# Patient Record
Sex: Male | Born: 1985
Health system: Southern US, Community
[De-identification: ages and names within clinical notes are randomized; demographics above are authoritative.]

## PROBLEM LIST (undated history)

## (undated) DIAGNOSIS — B20 Human immunodeficiency virus [HIV] disease: Secondary | ICD-10-CM

## (undated) DIAGNOSIS — Z21 Asymptomatic human immunodeficiency virus [HIV] infection status: Secondary | ICD-10-CM

## (undated) DIAGNOSIS — J189 Pneumonia, unspecified organism: Secondary | ICD-10-CM

## (undated) DIAGNOSIS — I10 Essential (primary) hypertension: Secondary | ICD-10-CM

## (undated) DIAGNOSIS — E119 Type 2 diabetes mellitus without complications: Secondary | ICD-10-CM

## (undated) DIAGNOSIS — A539 Syphilis, unspecified: Secondary | ICD-10-CM

---

## 2007-02-09 ENCOUNTER — Emergency Department (HOSPITAL_COMMUNITY): Admission: EM | Admit: 2007-02-09 | Discharge: 2007-02-09 | Payer: Self-pay | Admitting: Emergency Medicine

## 2008-08-23 ENCOUNTER — Emergency Department (HOSPITAL_COMMUNITY): Admission: EM | Admit: 2008-08-23 | Discharge: 2008-08-23 | Payer: Self-pay | Admitting: Emergency Medicine

## 2008-09-14 ENCOUNTER — Emergency Department (HOSPITAL_COMMUNITY): Admission: EM | Admit: 2008-09-14 | Discharge: 2008-09-14 | Payer: Self-pay | Admitting: Family Medicine

## 2008-09-17 ENCOUNTER — Emergency Department (HOSPITAL_COMMUNITY): Admission: EM | Admit: 2008-09-17 | Discharge: 2008-09-17 | Payer: Self-pay | Admitting: Emergency Medicine

## 2009-02-06 ENCOUNTER — Emergency Department (HOSPITAL_COMMUNITY): Admission: EM | Admit: 2009-02-06 | Discharge: 2009-02-06 | Payer: Self-pay | Admitting: Emergency Medicine

## 2009-10-17 ENCOUNTER — Emergency Department (HOSPITAL_COMMUNITY): Admission: EM | Admit: 2009-10-17 | Discharge: 2009-10-17 | Payer: Self-pay | Admitting: Family Medicine

## 2011-02-22 ENCOUNTER — Emergency Department (HOSPITAL_BASED_OUTPATIENT_CLINIC_OR_DEPARTMENT_OTHER)
Admission: EM | Admit: 2011-02-22 | Discharge: 2011-02-22 | Disposition: A | Discharge status: Home / Self Care | Payer: PRIVATE HEALTH INSURANCE | Attending: Orthopaedic Surgery | Admitting: Orthopaedic Surgery

## 2011-02-22 DIAGNOSIS — I10 Essential (primary) hypertension: Secondary | ICD-10-CM | POA: Insufficient documentation

## 2011-02-22 DIAGNOSIS — J329 Chronic sinusitis, unspecified: Secondary | ICD-10-CM | POA: Insufficient documentation

## 2011-02-22 DIAGNOSIS — J3489 Other specified disorders of nose and nasal sinuses: Secondary | ICD-10-CM | POA: Insufficient documentation

## 2011-09-16 LAB — POCT INFECTIOUS MONO SCREEN: Mono Screen: NEGATIVE

## 2011-12-26 ENCOUNTER — Encounter (HOSPITAL_BASED_OUTPATIENT_CLINIC_OR_DEPARTMENT_OTHER): Payer: Self-pay | Admitting: *Deleted

## 2011-12-26 ENCOUNTER — Emergency Department (HOSPITAL_BASED_OUTPATIENT_CLINIC_OR_DEPARTMENT_OTHER)
Admission: EM | Admit: 2011-12-26 | Discharge: 2011-12-26 | Disposition: A | Discharge status: Home / Self Care | Payer: PRIVATE HEALTH INSURANCE | Attending: Emergency Medicine | Admitting: Emergency Medicine

## 2011-12-26 DIAGNOSIS — R197 Diarrhea, unspecified: Secondary | ICD-10-CM | POA: Insufficient documentation

## 2011-12-26 DIAGNOSIS — J45909 Unspecified asthma, uncomplicated: Secondary | ICD-10-CM | POA: Insufficient documentation

## 2011-12-26 DIAGNOSIS — J029 Acute pharyngitis, unspecified: Secondary | ICD-10-CM | POA: Insufficient documentation

## 2011-12-26 LAB — RAPID STREP SCREEN (MED CTR MEBANE ONLY): Streptococcus, Group A Screen (Direct): NEGATIVE

## 2011-12-26 NOTE — ED Provider Notes (Signed)
History     CSN: [id number]  Arrival date & time 12/26/11  1819   First MD Initiated Contact with Patient 12/26/11 1823      Chief Complaint  Patient presents with  . Sore Throat  . Diarrhea    (Consider location/radiation/quality/duration/timing/severity/associated sxs/prior treatment) Patient is a 26 y.o. male presenting with pharyngitis and diarrhea. The history is provided by the patient. No language interpreter was used.  Sore Throat This is a new problem. The current episode started today. The problem occurs constantly. The problem has been unchanged. Pertinent negatives include no abdominal pain, fever, rash or vomiting. The symptoms are aggravated by swallowing. He has tried nothing for the symptoms.  Diarrhea The primary symptoms include diarrhea. Primary symptoms do not include fever, abdominal pain, vomiting or rash. The illness began yesterday. The problem has not changed since onset. The illness does not include back pain.    Past Medical History  Diagnosis Date  . Asthma     History reviewed. No pertinent past surgical history.  History reviewed. No pertinent family history.  History  Substance Use Topics  . Smoking status: Not on file  . Smokeless tobacco: Not on file  . Alcohol Use:       Review of Systems  Constitutional: Negative for fever.  Gastrointestinal: Positive for diarrhea. Negative for vomiting and abdominal pain.  Musculoskeletal: Negative for back pain.  Skin: Negative for rash.  All other systems reviewed and are negative.    Allergies  Review of patient's allergies indicates no known allergies.  Home Medications   Current Outpatient Rx  Name Route Sig Dispense Refill  . ALBUTEROL SULFATE HFA 108 (90 BASE) MCG/ACT IN AERS Inhalation Inhale 1 puff into the lungs every 6 (six) hours as needed. For shortness of breath    . LISINOPRIL 10 MG PO TABS Oral Take 10 mg by mouth daily.      BP 140/80  Pulse 83  Temp(Src) 98.9 F  (37.2 C) (Oral)  Resp 18  SpO2 100%  Physical Exam  Nursing note and vitals reviewed. Constitutional: He is oriented to person, place, and time. He appears well-developed and well-nourished.  HENT:  Head: Normocephalic.  Right Ear: External ear normal.  Left Ear: External ear normal.  Mouth/Throat: Posterior oropharyngeal erythema present.  Eyes: EOM are normal.  Neck: Normal range of motion. Neck supple.  Cardiovascular: Normal rate and regular rhythm.   Pulmonary/Chest: Effort normal and breath sounds normal.  Abdominal: Soft. Bowel sounds are normal.  Musculoskeletal: Normal range of motion.  Neurological: He is alert and oriented to person, place, and time.  Skin: Skin is warm.    ED Course  Procedures (including critical care time)   Labs Reviewed  RAPID STREP SCREEN   No results found.   1. Sore throat   2. Diarrhea       MDM  Symptoms likely viral:no concern for dehydration        Teressa Lower, NP 12/26/11 1937

## 2011-12-26 NOTE — ED Notes (Signed)
Pt amb to triage with quick steady gait in nad. Pt reports sore throat and diarrhea x today.

## 2011-12-26 NOTE — ED Provider Notes (Signed)
Medical screening examination/treatment/procedure(s) were performed by non-physician practitioner and as supervising physician I was immediately available for consultation/collaboration.   Dayton Bailiff, MD 12/26/11 2051

## 2012-02-16 ENCOUNTER — Emergency Department (HOSPITAL_COMMUNITY)
Admission: EM | Admit: 2012-02-16 | Discharge: 2012-02-16 | Disposition: A | Discharge status: Home / Self Care | Payer: PRIVATE HEALTH INSURANCE | Attending: Emergency Medicine | Admitting: Emergency Medicine

## 2012-02-16 ENCOUNTER — Encounter (HOSPITAL_COMMUNITY): Payer: Self-pay | Admitting: *Deleted

## 2012-02-16 DIAGNOSIS — Z79899 Other long term (current) drug therapy: Secondary | ICD-10-CM | POA: Insufficient documentation

## 2012-02-16 DIAGNOSIS — R51 Headache: Secondary | ICD-10-CM | POA: Insufficient documentation

## 2012-02-16 DIAGNOSIS — I10 Essential (primary) hypertension: Secondary | ICD-10-CM | POA: Insufficient documentation

## 2012-02-16 DIAGNOSIS — J45909 Unspecified asthma, uncomplicated: Secondary | ICD-10-CM | POA: Insufficient documentation

## 2012-02-16 DIAGNOSIS — H53149 Visual discomfort, unspecified: Secondary | ICD-10-CM | POA: Insufficient documentation

## 2012-02-16 HISTORY — DX: Essential (primary) hypertension: I10

## 2012-02-16 MED ORDER — DIPHENHYDRAMINE HCL 50 MG/ML IJ SOLN
25.0000 mg | Freq: Once | INTRAMUSCULAR | Status: AC
  Administered 2012-02-16: 25 mg via INTRAMUSCULAR
  Filled 2012-02-16: qty 1

## 2012-02-16 MED ORDER — METOCLOPRAMIDE HCL 5 MG/ML IJ SOLN
10.0000 mg | Freq: Once | INTRAMUSCULAR | Status: AC
  Administered 2012-02-16: 10 mg via INTRAMUSCULAR
  Filled 2012-02-16: qty 2

## 2012-02-16 MED ORDER — SUMATRIPTAN SUCCINATE 25 MG PO TABS: 25.0000 mg | ORAL_TABLET | ORAL | Status: DC | PRN

## 2012-02-16 MED ORDER — KETOROLAC TROMETHAMINE 30 MG/ML IJ SOLN
30.0000 mg | Freq: Once | INTRAMUSCULAR | Status: AC
  Administered 2012-02-16: 30 mg via INTRAMUSCULAR
  Filled 2012-02-16: qty 1

## 2012-02-16 NOTE — ED Notes (Signed)
Headache for 2 weeks he has seen his doctor and was given some rxs.  Today his pain became worse in chruch

## 2012-02-16 NOTE — ED Provider Notes (Signed)
History     CSN: [id number]  Arrival date & time 02/16/12  1437   First MD Initiated Contact with Patient 02/16/12 1503      Chief Complaint  Patient presents with  . Headache    (Consider location/radiation/quality/duration/timing/severity/associated sxs/prior treatment) HPI Comments: Patient reports gradual onset of a headache last evening.  He reports that he went to sleep, which he feels helped the pain, but the pain came back again this morning.  He reports that his headache at this time is the worst headache that he can remember.  He denies prior history of migraines.  He reports that he has had a similar headache intermittently over the past two weeks.  Patient also states that he has been under more stress at work over the last 2 weeks.  He has been seen by his PCP and was prescribed Mobic and Tramadol.  He states that this medication helps mildly.    Patient is a 26 y.o. male presenting with headaches. The history is provided by the patient.  Headache  The problem has been gradually worsening. The headache is associated with emotional stress. The pain is located in the frontal region. The pain does not radiate. Pertinent negatives include no fever, no palpitations, no syncope, no shortness of breath, no nausea and no vomiting. He has tried NSAIDs for the symptoms.    Past Medical History  Diagnosis Date  . Asthma   . Hypertension     History reviewed. No pertinent past surgical history.  History reviewed. No pertinent family history.  History  Substance Use Topics  . Smoking status: Never Smoker   . Smokeless tobacco: Not on file  . Alcohol Use: No      Review of Systems  Constitutional: Negative for fever and chills.  HENT: Negative for neck pain and neck stiffness.   Eyes: Positive for photophobia. Negative for pain, redness and visual disturbance.  Respiratory: Negative for shortness of breath.   Cardiovascular: Negative for chest pain, palpitations and  syncope.  Gastrointestinal: Negative for nausea and vomiting.  Musculoskeletal: Negative for gait problem.  Skin: Negative for rash.  Neurological: Positive for headaches. Negative for dizziness, syncope, weakness, light-headedness and numbness.  Psychiatric/Behavioral: Negative for confusion.    Allergies  Review of patient's allergies indicates no known allergies.  Home Medications   Current Outpatient Rx  Name Route Sig Dispense Refill  . LISINOPRIL 10 MG PO TABS Oral Take 10 mg by mouth daily.    . MELOXICAM 15 MG PO TABS Oral Take 15 mg by mouth daily as needed. For headache    . TRAMADOL HCL 50 MG PO TABS Oral Take 50-100 mg by mouth every 6 (six) hours as needed. For headache    . ALBUTEROL SULFATE HFA 108 (90 BASE) MCG/ACT IN AERS Inhalation Inhale 1 puff into the lungs every 6 (six) hours as needed. For shortness of breath      BP 147/91  Pulse 85  Temp(Src) 97.4 F (36.3 C) (Oral)  Resp 20  SpO2 98%  Physical Exam  Nursing note and vitals reviewed. Constitutional: He is oriented to person, place, and time. He appears well-developed and well-nourished. No distress.  HENT:  Head: Normocephalic and atraumatic.  Mouth/Throat: Oropharynx is clear and moist.  Eyes: EOM are normal. Pupils are equal, round, and reactive to light.  Neck: Normal range of motion. Neck supple. No rigidity. No Brudzinski's sign and no Kernig's sign noted.  Cardiovascular: Normal rate, regular rhythm and normal heart sounds.  Pulmonary/Chest: Effort normal and breath sounds normal. No respiratory distress. He has no wheezes.  Musculoskeletal: Normal range of motion.  Neurological: He is alert and oriented to person, place, and time. He has normal strength and normal reflexes. No cranial nerve deficit or sensory deficit. He displays a negative Romberg sign. Coordination and gait normal.       Normal Rapid alternating movements Normal finger to nose testing.  Skin: Skin is warm and dry. He is not  diaphoretic. No erythema.  Psychiatric: He has a normal mood and affect.    ED Course  Procedures (including critical care time)  Labs Reviewed - No data to display No results found.   No diagnosis found.  4:15 PM Reassessed patient.  He reports that his pain has improved.  MDM  Patient states that his headache today is worse than headaches he has had in the past.  However, patient does not appear to be in any distress.  Onset of headache was gradual.  No head trauma.  Patient does not have vision changes, ataxia, fever, neck pain or stiffness, or vomiting.  Normal neurological exam.  No signs of meningitis on exam.  Afebrile.  Pain improved with migraine cocktail.   Therefore, do not feel that any imaging is indicated at this time.  Patient given strict return instructions and told to return if pain becomes worse or symptoms change.  Patient in agreement with the plan.  Patient also instructed to follow up with PCP.  Patient briefly discussed with Dr. Lynelle Doctor.  He is in agreement based on the presentation and PE that CT head does not need to be done at this time.        Pascal Lux Viola, PA-C 02/16/12 2358

## 2012-02-18 NOTE — ED Provider Notes (Signed)
Medical screening examination/treatment/procedure(s) were performed by non-physician practitioner and as supervising physician I was immediately available for consultation/collaboration.    Celene Kras, MD 02/18/12 [phone]

## 2012-05-21 ENCOUNTER — Other Ambulatory Visit: Payer: Self-pay | Admitting: Family Medicine

## 2012-05-21 DIAGNOSIS — R51 Headache: Secondary | ICD-10-CM

## 2012-05-23 ENCOUNTER — Emergency Department (HOSPITAL_BASED_OUTPATIENT_CLINIC_OR_DEPARTMENT_OTHER)
Admission: EM | Admit: 2012-05-23 | Discharge: 2012-05-23 | Disposition: A | Discharge status: Home / Self Care | Payer: PRIVATE HEALTH INSURANCE | Attending: Emergency Medicine | Admitting: Emergency Medicine

## 2012-05-23 ENCOUNTER — Encounter (HOSPITAL_BASED_OUTPATIENT_CLINIC_OR_DEPARTMENT_OTHER): Payer: Self-pay | Admitting: Emergency Medicine

## 2012-05-23 ENCOUNTER — Emergency Department (HOSPITAL_BASED_OUTPATIENT_CLINIC_OR_DEPARTMENT_OTHER): Payer: PRIVATE HEALTH INSURANCE

## 2012-05-23 ENCOUNTER — Other Ambulatory Visit: Payer: PRIVATE HEALTH INSURANCE

## 2012-05-23 DIAGNOSIS — R1310 Dysphagia, unspecified: Secondary | ICD-10-CM | POA: Insufficient documentation

## 2012-05-23 DIAGNOSIS — R109 Unspecified abdominal pain: Secondary | ICD-10-CM | POA: Insufficient documentation

## 2012-05-23 DIAGNOSIS — K209 Esophagitis, unspecified without bleeding: Secondary | ICD-10-CM

## 2012-05-23 DIAGNOSIS — J45909 Unspecified asthma, uncomplicated: Secondary | ICD-10-CM | POA: Insufficient documentation

## 2012-05-23 DIAGNOSIS — I10 Essential (primary) hypertension: Secondary | ICD-10-CM | POA: Insufficient documentation

## 2012-05-23 MED ORDER — OMEPRAZOLE 20 MG PO CPDR: 20.0000 mg | DELAYED_RELEASE_CAPSULE | Freq: Two times a day (BID) | ORAL | Status: DC

## 2012-05-23 NOTE — Discharge Instructions (Signed)
Dysphagia  Swallowing problems (dysphagia) occur when solids and liquids seem to stick in your throat on the way down to your stomach, or the food takes longer to get to the stomach. Other symptoms (problems) include regurgitating (burping) up food, noises coming from the throat, chest discomfort with swallowing, and a feeling of fullness in the throat when swallowing. When blockage in the throat is complete it may be associated with drooling.  CAUSES  There are many causes of swallowing difficulties and the following is generalized information regarding a number of reasons for this problem. Problems with swallowing may occur because of problems with the muscles. The food cannot be propelled in the usual manner into the stomach. There may be ulcers, scar tissueor inflammation (soreness) in the esophagus (the food tube from the mouth to the stomach) which blocks food from passing normally into the stomach. Causes of inflammation include acid reflux from the stomach into the esophagus. Inflammation can also be caused by the herpes simplex virus, Candida (yeast), radiation (as with treatment of cancer), or inflammation from medications not taken with adequate fluids to wash them down into the stomach. There may be nerve problems so signals cannot be sent adequately telling the muscles of the esophagus to contract and move the food along. Achalasia is a rare disorder of the esophagus in which muscular contractions of the esophagus are uncoordinated. Globus hystericus is a relatively common problem in young females in which there is a sense of an obstruction or difficulty in swallowing, but in which no abnormalities can be found. This problem usually improves over time with reassurance and testing to rule out other causes.  DIAGNOSIS  A number of tests will help your caregiver know what is the cause of your swallowing problems. These tests may include a barium swallow in which x-rays are taken while you are drinking a  liquid that outlines the lining of the esophagus on x-ray. If the stomach and small bowel are also studied in this manner it is called an upper gastrointestinal exam (UGI). Endoscopy may be done in which your caregiver examines your throat, esophagus, stomach and small bowel with an instrument like a small flexible telescope. Motility studies which measure the effectiveness and coordination of the muscular contractions of the esophagus may also be done.  TREATMENT  The treatment of swallowing problems are many, varying from medications to surgical treatment. The treatment varies with the type of problem found. Your caregiver will discuss your results and treatment with you. If swallowing problems are severe the long term problems which may occur include: malnutrition, pneumonia (from food going into the breathing tubes called trachea and bronchi), and an increase in tumors (lumps) of the esophagus.  SEEK IMMEDIATE MEDICAL CARE IF:   Food or other object becomes lodged in your throat or esophagus and won't move.  Document Released: 11/29/2000 Document Revised: 11/21/2011 Document Reviewed: 07/20/2008  [employer] Patient Information 2012 [employer]

## 2012-05-23 NOTE — ED Notes (Signed)
Pt states he has been having some difficulty when eating/drinking.  Pt states it is difficult for things to go down.  Pt having some epigastric pain x 2 days with eating and drinking.

## 2012-05-23 NOTE — ED Provider Notes (Signed)
History     CSN: [id number]  Arrival date & time 05/23/12  1035   First MD Initiated Contact with Patient 05/23/12 1056      Chief Complaint  Patient presents with  . Abdominal Pain  . GI Problem    (Consider location/radiation/quality/duration/timing/severity/associated sxs/prior treatment) HPI Comments: Patient states that 2 days ago he started with pain with swallowing.  He denies any vomiting or diarrhea or fever.  He says that every time he eats, he starts with pain in the chest while swallowing.  He is fine at all other times.  No exertional component, shortness or breath or other issues.  Patient is a 26 y.o. male presenting with abdominal pain and GI illlness. The history is provided by the patient.  Abdominal Pain The primary symptoms of the illness include abdominal pain. The primary symptoms of the illness do not include fever, nausea, vomiting or diarrhea. The current episode started 2 days ago. The onset of the illness was gradual. The problem has been gradually worsening.  The illness is associated with eating. The patient has not had a change in bowel habit.  GI Problem  Associated symptoms include abdominal pain. Pertinent negatives include no vomiting.    Past Medical History  Diagnosis Date  . Asthma   . Hypertension     History reviewed. No pertinent past surgical history.  History reviewed. No pertinent family history.  History  Substance Use Topics  . Smoking status: Never Smoker   . Smokeless tobacco: Not on file  . Alcohol Use: No      Review of Systems  Constitutional: Negative for fever.  Gastrointestinal: Positive for abdominal pain. Negative for nausea, vomiting and diarrhea.  All other systems reviewed and are negative.    Allergies  Review of patient's allergies indicates no known allergies.  Home Medications   Current Outpatient Rx  Name Route Sig Dispense Refill  . ALBUTEROL SULFATE HFA 108 (90 BASE) MCG/ACT IN AERS Inhalation  Inhale 1 puff into the lungs every 6 (six) hours as needed. For shortness of breath    . LISINOPRIL 10 MG PO TABS Oral Take 10 mg by mouth daily.      BP 121/74  Pulse 94  Temp(Src) 99.3 F (37.4 C) (Oral)  Resp 16  Ht 5\' 9"  (1.753 m)  Wt 220 lb (99.791 kg)  BMI 32.49 kg/m2  SpO2 99%  Physical Exam  Nursing note and vitals reviewed. Constitutional: He is oriented to person, place, and time. He appears well-developed and well-nourished. No distress.  HENT:  Head: Normocephalic and atraumatic.  Neck: Normal range of motion.  Cardiovascular: Normal rate and regular rhythm.   No murmur heard. Pulmonary/Chest: Effort normal and breath sounds normal. No respiratory distress. He has no wheezes.  Abdominal: Soft. Bowel sounds are normal. He exhibits no distension. There is no tenderness.  Musculoskeletal: Normal range of motion. He exhibits no edema.  Neurological: He is alert and oriented to person, place, and time.  Skin: Skin is warm and dry. He is not diaphoretic.    ED Course  Procedures (including critical care time)  Labs Reviewed - No data to display No results found.   No diagnosis found.    MDM  The patient presents with odynophagia, the cause of which is likely gerd.  I performed an xray of the chest which appears okay.  There is no sign of esophageal perforation or mediastinal air.  He will be discharged to home with prilosec bid.  To  return prn for worsening symptoms.        Geoffery Lyons, MD 05/23/12 [phone]

## 2012-05-25 ENCOUNTER — Encounter (HOSPITAL_COMMUNITY): Payer: Self-pay | Admitting: *Deleted

## 2012-05-25 ENCOUNTER — Emergency Department (HOSPITAL_COMMUNITY)
Admission: EM | Admit: 2012-05-25 | Discharge: 2012-05-26 | Disposition: A | Discharge status: Home / Self Care | Payer: PRIVATE HEALTH INSURANCE | Attending: Emergency Medicine | Admitting: Emergency Medicine

## 2012-05-25 DIAGNOSIS — D72819 Decreased white blood cell count, unspecified: Secondary | ICD-10-CM | POA: Insufficient documentation

## 2012-05-25 DIAGNOSIS — R079 Chest pain, unspecified: Secondary | ICD-10-CM | POA: Insufficient documentation

## 2012-05-25 DIAGNOSIS — I10 Essential (primary) hypertension: Secondary | ICD-10-CM | POA: Insufficient documentation

## 2012-05-25 DIAGNOSIS — K209 Esophagitis, unspecified without bleeding: Secondary | ICD-10-CM | POA: Insufficient documentation

## 2012-05-25 DIAGNOSIS — J45909 Unspecified asthma, uncomplicated: Secondary | ICD-10-CM | POA: Insufficient documentation

## 2012-05-25 DIAGNOSIS — Z79899 Other long term (current) drug therapy: Secondary | ICD-10-CM | POA: Insufficient documentation

## 2012-05-25 LAB — CBC
MCH: 31 pg (ref 26.0–34.0)
MCV: 85.6 fL (ref 78.0–100.0)
Platelets: 170 10*3/uL (ref 150–400)
RBC: 4.94 MIL/uL (ref 4.22–5.81)
WBC: 2.3 10*3/uL — ABNORMAL LOW (ref 4.0–10.5)

## 2012-05-25 LAB — DIFFERENTIAL
Basophils Relative: 0 % (ref 0–1)
Neutro Abs: 1.1 10*3/uL — ABNORMAL LOW (ref 1.7–7.7)

## 2012-05-25 NOTE — ED Notes (Signed)
The pt has had mid-chest pain since thrusday no nv or sob.  No previous history

## 2012-05-26 ENCOUNTER — Emergency Department (HOSPITAL_COMMUNITY): Payer: PRIVATE HEALTH INSURANCE

## 2012-05-26 LAB — COMPREHENSIVE METABOLIC PANEL
ALT: 20 U/L (ref 0–53)
AST: 31 U/L (ref 0–37)
Albumin: 3.6 g/dL (ref 3.5–5.2)
Alkaline Phosphatase: 41 U/L (ref 39–117)
BUN: 10 mg/dL (ref 6–23)
CO2: 24 mEq/L (ref 19–32)
Chloride: 98 mEq/L (ref 96–112)
Total Protein: 7 g/dL (ref 6.0–8.3)

## 2012-05-26 LAB — TROPONIN I: Troponin I: <0.3 ng/mL (ref <0.30)

## 2012-05-26 MED ORDER — SUCRALFATE 1 G PO TABS: 1.0000 g | ORAL_TABLET | Freq: Four times a day (QID) | ORAL | Status: DC

## 2012-05-26 MED ORDER — GI COCKTAIL ~~LOC~~
30.0000 mL | Freq: Once | ORAL | Status: AC
  Administered 2012-05-26: 30 mL via ORAL
  Filled 2012-05-26: qty 30

## 2012-05-26 MED ORDER — SUCRALFATE 1 G PO TABS
1.0000 g | ORAL_TABLET | Freq: Once | ORAL | Status: AC
  Administered 2012-05-26: 1 g via ORAL
  Filled 2012-05-26: qty 1

## 2012-05-26 NOTE — ED Notes (Signed)
Pt presented to ED with central chest pain and non radiating which started on Thursday.According to pt it increases with food or water intake.He describes it as burning sensation.he gives a history of hypertension and is on antihypertension medications.GCS 15

## 2012-05-26 NOTE — Discharge Instructions (Signed)
Take medications as prescribed. It may take several days before you see effect of the medications. State to a bland diet. Please call the gastroenterology office in the morning for close followup. Return to the emergency room for worsening condition or new concerning symptoms.  Your blood work today showed a low white blood cell count.  Follow up with your doctor for recheck and further workup of this finding.

## 2012-05-26 NOTE — ED Notes (Signed)
Pt feeling better and says the burning sensation and pain in the chest has improved.

## 2012-05-26 NOTE — ED Notes (Signed)
Pt for discharge.Pt says he feels better.GCS 15.No complaints and driving home by himself.He is alert,oriented and ambulatory and vital signs stable.

## 2012-05-27 ENCOUNTER — Other Ambulatory Visit: Payer: Self-pay | Admitting: Family Medicine

## 2012-05-27 ENCOUNTER — Inpatient Hospital Stay: Admission: RE | Admit: 2012-05-27 | Payer: PRIVATE HEALTH INSURANCE | Source: Ambulatory Visit

## 2012-05-27 ENCOUNTER — Inpatient Hospital Stay
Admission: RE | Admit: 2012-05-27 | Discharge: 2012-05-27 | Payer: PRIVATE HEALTH INSURANCE | Source: Ambulatory Visit | Attending: Family Medicine | Admitting: Family Medicine

## 2012-05-27 DIAGNOSIS — R51 Headache: Secondary | ICD-10-CM

## 2012-05-27 NOTE — ED Provider Notes (Signed)
History     CSN: [id number]  Arrival date & time 05/25/12  2226   First MD Initiated Contact with Patient 05/26/12 0250      Chief Complaint  Patient presents with  . Chest Pain    (Consider location/radiation/quality/duration/timing/severity/associated sxs/prior treatment) HPI 26 year old male presents to emergency room complaining of mid chest pain. Pain is described as burning in nature, worse with eating or drinking. Patient was seen by his primary care Dr on Thursday for these symptoms, 4 days ago, and started on Prilosec. He reports that he has taken it once or twice without improvement in his symptoms. Patient was seen 2 days ago in [facility] ER, at that time he was instructed to take Prilosec, he had negative chest x-ray. Patient history of hypertension. Patient denies shortness of breath, nausea, leg swelling, PE or DVT.    Past Medical History  Diagnosis Date  . Asthma   . Hypertension     History reviewed. No pertinent past surgical history.  No family history on file.  History  Substance Use Topics  . Smoking status: Never Smoker   . Smokeless tobacco: Not on file  . Alcohol Use: No      Review of Systems  All other systems reviewed and are negative.   other than listed in history of present illness  Allergies  Review of patient's allergies indicates no known allergies.  Home Medications   Current Outpatient Rx  Name Route Sig Dispense Refill  . ALBUTEROL SULFATE HFA 108 (90 BASE) MCG/ACT IN AERS Inhalation Inhale 1 puff into the lungs every 6 (six) hours as needed. For shortness of breath    . LISINOPRIL 10 MG PO TABS Oral Take 10 mg by mouth daily.    Marland Kitchen OMEPRAZOLE 20 MG PO CPDR Oral Take 1 capsule (20 mg total) by mouth 2 (two) times daily. 50 capsule 1  . SUCRALFATE 1 G PO TABS Oral Take 1 tablet (1 g total) by mouth 4 (four) times daily. 40 tablet 0    BP 108/79  Pulse 74  Temp 98.9 F (37.2 C) (Oral)  Resp 17  SpO2  100%  Physical Exam  Nursing note and vitals reviewed. Constitutional: He is oriented to person, place, and time. He appears well-developed and well-nourished.  HENT:  Head: Normocephalic and atraumatic.  Nose: Nose normal.  Mouth/Throat: Oropharynx is clear and moist.  Eyes: Conjunctivae and EOM are normal. Pupils are equal, round, and reactive to light.  Neck: Normal range of motion. Neck supple. No JVD present. No tracheal deviation present. No thyromegaly present.  Cardiovascular: Normal rate, regular rhythm, normal heart sounds and intact distal pulses.  Exam reveals no gallop and no friction rub.   No murmur heard. Pulmonary/Chest: Effort normal and breath sounds normal. No stridor. No respiratory distress. He has no wheezes. He has no rales. He exhibits no tenderness.  Abdominal: Soft. Bowel sounds are normal. He exhibits no distension and no mass. There is no tenderness. There is no rebound and no guarding.  Musculoskeletal: Normal range of motion. He exhibits no edema and no tenderness.  Lymphadenopathy:    He has no cervical adenopathy.  Neurological: He is oriented to person, place, and time. He exhibits normal muscle tone. Coordination normal.  Skin: Skin is dry. No rash noted. No erythema. No pallor.  Psychiatric: He has a normal mood and affect. His behavior is normal. Judgment and thought content normal.    ED Course  Procedures (including critical care  time)  Labs Reviewed  CBC - Abnormal; Notable for the following:    WBC 2.3 (*)     MCHC 36.2 (*)     All other components within normal limits  DIFFERENTIAL - Abnormal; Notable for the following:    Neutro Abs 1.1 (*)     All other components within normal limits  COMPREHENSIVE METABOLIC PANEL - Abnormal; Notable for the following:    Sodium 134 (*)     Potassium 3.4 (*)     Glucose, Bld 140 (*)     All other components within normal limits  TROPONIN I  LAB REPORT - SCANNED   Dg Chest 2 View  05/26/2012   *RADIOLOGY REPORT*  Clinical Data: Chest pain  CHEST - 2 VIEW  Comparison: 05/23/2012  Findings: Lateral view degraded by patient arm position.  Midline trachea.  Normal heart size and mediastinal contours. No pleural effusion or pneumothorax.  Clear lungs.  IMPRESSION: Normal chest.  Original Report Authenticated By: Consuello Bossier, M.D.    Date: 05/25/2012  Rate:88  Rhythm: normal sinus rhythm  QRS Axis: normal  Intervals: normal  ST/T Wave abnormalities: normal  Conduction Disutrbances:none  Narrative Interpretation:   Old EKG Reviewed: none available   1. Esophagitis   2. Leukopenia       MDM    26 year old male with mid chest pain worse with eating, seems consistent with GERD and/or esophagitis. Patient noted to have leukopenia, discuss with patient he denies any risk factors for HIV, no recent viral type illnesses. Patient with resolution of pain after GI cocktail. Will refer patient to gastroenterology for upper endoscopy. Patient to followup with his primary care doctor to have his blood work rechecked in a week to 10 days to follow on his leukopenia.        Olivia Mackie, MD 05/27/12 [phone]

## 2013-10-08 ENCOUNTER — Telehealth: Payer: Self-pay

## 2013-10-08 NOTE — Telephone Encounter (Signed)
Patient contacted regarding new intake appointment. Date and time given. Information given regarding documents needed to qualify for financial eligibility.  Tammy K Creedon, RN  

## 2013-10-12 ENCOUNTER — Ambulatory Visit: Payer: PRIVATE HEALTH INSURANCE

## 2013-12-12 ENCOUNTER — Emergency Department (HOSPITAL_BASED_OUTPATIENT_CLINIC_OR_DEPARTMENT_OTHER)
Admission: EM | Admit: 2013-12-12 | Discharge: 2013-12-12 | Discharge status: Against Medical Advice | Payer: PRIVATE HEALTH INSURANCE | Attending: Emergency Medicine | Admitting: Emergency Medicine

## 2013-12-12 ENCOUNTER — Encounter (HOSPITAL_BASED_OUTPATIENT_CLINIC_OR_DEPARTMENT_OTHER): Payer: Self-pay | Admitting: Emergency Medicine

## 2013-12-12 DIAGNOSIS — IMO0002 Reserved for concepts with insufficient information to code with codable children: Secondary | ICD-10-CM | POA: Insufficient documentation

## 2013-12-12 DIAGNOSIS — I10 Essential (primary) hypertension: Secondary | ICD-10-CM | POA: Insufficient documentation

## 2013-12-12 DIAGNOSIS — R369 Urethral discharge, unspecified: Secondary | ICD-10-CM | POA: Insufficient documentation

## 2013-12-12 DIAGNOSIS — S39848A Other specified injuries of external genitals, initial encounter: Secondary | ICD-10-CM | POA: Insufficient documentation

## 2013-12-12 DIAGNOSIS — S3994XA Unspecified injury of external genitals, initial encounter: Secondary | ICD-10-CM | POA: Insufficient documentation

## 2013-12-12 DIAGNOSIS — J45909 Unspecified asthma, uncomplicated: Secondary | ICD-10-CM | POA: Insufficient documentation

## 2013-12-12 DIAGNOSIS — Y929 Unspecified place or not applicable: Secondary | ICD-10-CM | POA: Insufficient documentation

## 2013-12-12 DIAGNOSIS — Y9383 Activity, rough housing and horseplay: Secondary | ICD-10-CM | POA: Insufficient documentation

## 2013-12-12 LAB — URINE MICROSCOPIC-ADD ON

## 2013-12-12 LAB — URINALYSIS, ROUTINE W REFLEX MICROSCOPIC
Nitrite: NEGATIVE
Protein, ur: NEGATIVE mg/dL
Urobilinogen, UA: 1 mg/dL (ref 0.0–1.0)
pH: 6.5 (ref 5.0–8.0)

## 2013-12-12 NOTE — ED Notes (Signed)
Attempted to pull patient to room.  Pt was called with no response.  Consulting civil engineer notified

## 2013-12-12 NOTE — ED Notes (Signed)
Patient states that he was playing with his cousins on Christmas day and was accidentally kicked in the groin, since then has noticed pain and clear discharge. States his last sexual encounter was appx 3 months ago, patient does use condoms.

## 2013-12-13 ENCOUNTER — Emergency Department (HOSPITAL_BASED_OUTPATIENT_CLINIC_OR_DEPARTMENT_OTHER)
Admission: EM | Admit: 2013-12-13 | Discharge: 2013-12-13 | Disposition: A | Discharge status: Home / Self Care | Payer: PRIVATE HEALTH INSURANCE | Attending: Emergency Medicine | Admitting: Emergency Medicine

## 2013-12-13 ENCOUNTER — Encounter (HOSPITAL_BASED_OUTPATIENT_CLINIC_OR_DEPARTMENT_OTHER): Payer: Self-pay | Admitting: Emergency Medicine

## 2013-12-13 DIAGNOSIS — Z79899 Other long term (current) drug therapy: Secondary | ICD-10-CM | POA: Insufficient documentation

## 2013-12-13 DIAGNOSIS — J45909 Unspecified asthma, uncomplicated: Secondary | ICD-10-CM | POA: Insufficient documentation

## 2013-12-13 DIAGNOSIS — I10 Essential (primary) hypertension: Secondary | ICD-10-CM | POA: Insufficient documentation

## 2013-12-13 DIAGNOSIS — A64 Unspecified sexually transmitted disease: Secondary | ICD-10-CM | POA: Insufficient documentation

## 2013-12-13 LAB — GC/CHLAMYDIA PROBE AMP
CT Probe RNA: NEGATIVE
GC Probe RNA: NEGATIVE

## 2013-12-13 MED ORDER — LIDOCAINE HCL (PF) 1 % IJ SOLN
INTRAMUSCULAR | Status: AC
  Administered 2013-12-13: 0.9 mL
  Filled 2013-12-13: qty 5

## 2013-12-13 MED ORDER — DOXYCYCLINE HYCLATE 100 MG PO CAPS: 100.0000 mg | ORAL_CAPSULE | Freq: Two times a day (BID) | ORAL | Status: DC

## 2013-12-13 MED ORDER — CEFTRIAXONE SODIUM 250 MG IJ SOLR
250.0000 mg | INTRAMUSCULAR | Status: DC
  Administered 2013-12-13: 250 mg via INTRAMUSCULAR
  Filled 2013-12-13: qty 250

## 2013-12-13 MED ORDER — AZITHROMYCIN 250 MG PO TABS
1000.0000 mg | ORAL_TABLET | Freq: Once | ORAL | Status: AC
  Administered 2013-12-13: 1000 mg via ORAL
  Filled 2013-12-13: qty 4

## 2013-12-13 NOTE — ED Notes (Signed)
Groin pain since 12/25. White d/c from penis x 2 days. Denies sexual activity in the last 3 months

## 2013-12-13 NOTE — ED Provider Notes (Addendum)
CSN: [id number]     Arrival date & time 12/13/13  4098 History   None    Chief Complaint  Patient presents with  . Groin Pain   (Consider location/radiation/quality/duration/timing/severity/associated sxs/prior Treatment) Patient is a 27 y.o. male presenting with penile discharge. The history is provided by the patient. No language interpreter was used.  Penile Discharge This is a new problem. The current episode started 2 days ago. The problem occurs constantly. The problem has not changed since onset.Pertinent negatives include no chest pain, no abdominal pain, no headaches and no shortness of breath. Nothing aggravates the symptoms. Nothing relieves the symptoms. He has tried nothing for the symptoms. The treatment provided no relief.    Past Medical History  Diagnosis Date  . Asthma   . Hypertension    History reviewed. No pertinent past surgical history. No family history on file. History  Substance Use Topics  . Smoking status: Never Smoker   . Smokeless tobacco: Never Used  . Alcohol Use: No    Review of Systems  Respiratory: Negative for shortness of breath.   Cardiovascular: Negative for chest pain.  Gastrointestinal: Negative for abdominal pain.  Genitourinary: Positive for discharge.  Neurological: Negative for headaches.  All other systems reviewed and are negative.    Allergies  Review of patient's allergies indicates no known allergies.  Home Medications   Current Outpatient Rx  Name  Route  Sig  Dispense  Refill  . albuterol (PROVENTIL HFA;VENTOLIN HFA) 108 (90 BASE) MCG/ACT inhaler   Inhalation   Inhale 1 puff into the lungs every 6 (six) hours as needed. For shortness of breath         . lisinopril (PRINIVIL,ZESTRIL) 10 MG tablet   Oral   Take 10 mg by mouth daily.         Marland Kitchen EXPIRED: omeprazole (PRILOSEC) 20 MG capsule   Oral   Take 1 capsule (20 mg total) by mouth 2 (two) times daily.   50 capsule   1   . EXPIRED: sucralfate (CARAFATE)  1 G tablet   Oral   Take 1 tablet (1 g total) by mouth 4 (four) times daily.   40 tablet   0    BP 134/94  Pulse 103  Temp(Src) 99.5 F (37.5 C) (Oral)  Resp 20  Ht 5\' 9"  (1.753 m)  Wt 202 lb (91.627 kg)  BMI 29.82 kg/m2  SpO2 100% Physical Exam  Constitutional: He is oriented to person, place, and time. He appears well-developed and well-nourished. No distress.  HENT:  Head: Normocephalic and atraumatic.  Mouth/Throat: Oropharynx is clear and moist.  Eyes: Conjunctivae are normal. Pupils are equal, round, and reactive to light.  Neck: Normal range of motion. Neck supple.  Cardiovascular: Normal rate, regular rhythm and intact distal pulses.   Pulmonary/Chest: Effort normal and breath sounds normal. He has no wheezes. He has no rales.  Abdominal: Soft. Bowel sounds are normal. There is no tenderness. There is no rebound and no guarding.  Genitourinary:  Yellowish discharge  Chaperone present  Musculoskeletal: Normal range of motion.  Neurological: He is alert and oriented to person, place, and time.  Skin: Skin is warm and dry.  Psychiatric: He has a normal mood and affect.    ED Course  Procedures (including critical care time) Labs Review Labs Reviewed - No data to display Imaging Review No results found.  EKG Interpretation   None       MDM  No diagnosis found. Inform all  partners, all partners must be notified and treated no sexual activity until 7 days after all partners treated   Hanifa Antonetti K Aaylah Pokorny-Rasch, MD 12/13/13 0544  Taneal Sonntag K Khiara Shuping-Rasch, MD 12/13/13 [phone]

## 2013-12-14 LAB — URINE CULTURE: Culture: NO GROWTH

## 2014-08-11 ENCOUNTER — Emergency Department (HOSPITAL_BASED_OUTPATIENT_CLINIC_OR_DEPARTMENT_OTHER)
Admission: EM | Admit: 2014-08-11 | Discharge: 2014-08-11 | Disposition: A | Discharge status: Home / Self Care | Payer: PRIVATE HEALTH INSURANCE | Attending: Emergency Medicine | Admitting: Emergency Medicine

## 2014-08-11 ENCOUNTER — Emergency Department (HOSPITAL_BASED_OUTPATIENT_CLINIC_OR_DEPARTMENT_OTHER): Payer: PRIVATE HEALTH INSURANCE

## 2014-08-11 ENCOUNTER — Encounter (HOSPITAL_BASED_OUTPATIENT_CLINIC_OR_DEPARTMENT_OTHER): Payer: Self-pay | Admitting: Emergency Medicine

## 2014-08-11 DIAGNOSIS — K5289 Other specified noninfective gastroenteritis and colitis: Secondary | ICD-10-CM | POA: Diagnosis not present

## 2014-08-11 DIAGNOSIS — I10 Essential (primary) hypertension: Secondary | ICD-10-CM | POA: Diagnosis not present

## 2014-08-11 DIAGNOSIS — K529 Noninfective gastroenteritis and colitis, unspecified: Secondary | ICD-10-CM

## 2014-08-11 DIAGNOSIS — Z79899 Other long term (current) drug therapy: Secondary | ICD-10-CM | POA: Insufficient documentation

## 2014-08-11 DIAGNOSIS — J45909 Unspecified asthma, uncomplicated: Secondary | ICD-10-CM | POA: Diagnosis not present

## 2014-08-11 DIAGNOSIS — R109 Unspecified abdominal pain: Secondary | ICD-10-CM | POA: Diagnosis present

## 2014-08-11 DIAGNOSIS — Z792 Long term (current) use of antibiotics: Secondary | ICD-10-CM | POA: Diagnosis not present

## 2014-08-11 DIAGNOSIS — R110 Nausea: Secondary | ICD-10-CM | POA: Diagnosis not present

## 2014-08-11 LAB — URINALYSIS, ROUTINE W REFLEX MICROSCOPIC
Bilirubin Urine: NEGATIVE
GLUCOSE, UA: NEGATIVE mg/dL
HGB URINE DIPSTICK: NEGATIVE
Ketones, ur: NEGATIVE mg/dL
Leukocytes, UA: NEGATIVE
Nitrite: NEGATIVE
PH: 6.5 (ref 5.0–8.0)
Protein, ur: NEGATIVE mg/dL
SPECIFIC GRAVITY, URINE: 1.017 (ref 1.005–1.030)
Urobilinogen, UA: 0.2 mg/dL (ref 0.0–1.0)

## 2014-08-11 LAB — COMPREHENSIVE METABOLIC PANEL
ALBUMIN: 4.2 g/dL (ref 3.5–5.2)
ALT: 24 U/L (ref 0–53)
AST: 23 U/L (ref 0–37)
Alkaline Phosphatase: 50 U/L (ref 39–117)
Anion gap: 11 (ref 5–15)
BUN: 7 mg/dL (ref 6–23)
CALCIUM: 9.9 mg/dL (ref 8.4–10.5)
CO2: 28 mEq/L (ref 19–32)
Chloride: 101 mEq/L (ref 96–112)
Creatinine, Ser: 1 mg/dL (ref 0.50–1.35)
GFR calc Af Amer: >90 mL/min (ref >90)
GFR calc non Af Amer: >90 mL/min (ref >90)
Glucose, Bld: 196 mg/dL — ABNORMAL HIGH (ref 70–99)
Potassium: 4.1 mEq/L (ref 3.7–5.3)
SODIUM: 140 meq/L (ref 137–147)
TOTAL PROTEIN: 8.1 g/dL (ref 6.0–8.3)
Total Bilirubin: 0.4 mg/dL (ref 0.3–1.2)

## 2014-08-11 LAB — CBC WITH DIFFERENTIAL/PLATELET
Basophils Absolute: 0 10*3/uL (ref 0.0–0.1)
Basophils Relative: 0 % (ref 0–1)
EOS ABS: 0 10*3/uL (ref 0.0–0.7)
EOS PCT: 1 % (ref 0–5)
HCT: 44.1 % (ref 39.0–52.0)
Hemoglobin: 15.7 g/dL (ref 13.0–17.0)
Lymphocytes Relative: 18 % (ref 12–46)
Lymphs Abs: 0.9 10*3/uL (ref 0.7–4.0)
MCH: 31.3 pg (ref 26.0–34.0)
MCHC: 35.6 g/dL (ref 30.0–36.0)
MCV: 88 fL (ref 78.0–100.0)
Monocytes Absolute: 0.3 10*3/uL (ref 0.1–1.0)
Monocytes Relative: 6 % (ref 3–12)
Neutro Abs: 3.9 10*3/uL (ref 1.7–7.7)
Neutrophils Relative %: 75 % (ref 43–77)
PLATELETS: 205 10*3/uL (ref 150–400)
RBC: 5.01 MIL/uL (ref 4.22–5.81)
RDW: 12.2 % (ref 11.5–15.5)
WBC: 5.2 10*3/uL (ref 4.0–10.5)

## 2014-08-11 MED ORDER — SODIUM CHLORIDE 0.9 % IV BOLUS (SEPSIS)
1000.0000 mL | Freq: Once | INTRAVENOUS | Status: AC
  Administered 2014-08-11: 1000 mL via INTRAVENOUS

## 2014-08-11 MED ORDER — LISINOPRIL 10 MG PO TABS: 10.0000 mg | ORAL_TABLET | Freq: Every day | ORAL | Status: DC

## 2014-08-11 MED ORDER — IOHEXOL 300 MG/ML  SOLN
50.0000 mL | Freq: Once | INTRAMUSCULAR | Status: AC | PRN
  Administered 2014-08-11: 50 mL via ORAL

## 2014-08-11 MED ORDER — CIPROFLOXACIN HCL 500 MG PO TABS: 500.0000 mg | ORAL_TABLET | Freq: Two times a day (BID) | ORAL | Status: DC

## 2014-08-11 MED ORDER — IOHEXOL 300 MG/ML  SOLN
100.0000 mL | Freq: Once | INTRAMUSCULAR | Status: AC | PRN
  Administered 2014-08-11: 100 mL via INTRAVENOUS

## 2014-08-11 MED ORDER — DICYCLOMINE HCL 20 MG PO TABS: 20.0000 mg | ORAL_TABLET | Freq: Two times a day (BID) | ORAL | Status: DC

## 2014-08-11 MED ORDER — METRONIDAZOLE 500 MG PO TABS: 500.0000 mg | ORAL_TABLET | Freq: Two times a day (BID) | ORAL | Status: DC

## 2014-08-11 NOTE — Discharge Instructions (Signed)

## 2014-08-11 NOTE — ED Notes (Signed)
Pt ate a company lunch yesterday and later had onset of generalized abd pain. Over the course of the night, the pain became intermittent but pt now has frequent diarrhea. Pt denies N/V.

## 2014-08-11 NOTE — ED Provider Notes (Signed)
CSN: [id number]     Arrival date & time 08/11/14  0711 History   First MD Initiated Contact with Patient 08/11/14 [phone]     Chief Complaint  Patient presents with  . Abdominal Pain     (Consider location/radiation/quality/duration/timing/severity/associated sxs/prior Treatment) HPI Comments: Patient presents with abdominal pain. He states that yesterday he ate a sandwich and salad from [employer]. He states that last night, he started having diarrhea and his had multiple episodes of diarrhea since that time. He's had worsening aching pain in his abdomen. He describes it as all over his abdomen. He's had some nausea but no vomiting. He denies any fevers or chills although he was noted to have a temperature 100.9 in the ED today. He denies a past history of abdominal surgeries. He denies any urinary symptoms. He's not taking anything at home for the pain. He has tried some ginger ale without relief.  Patient is a 28 y.o. male presenting with abdominal pain.  Abdominal Pain Associated symptoms: diarrhea (watery, non-bloody) and nausea   Associated symptoms: no chest pain, no chills, no cough, no fatigue, no fever, no hematuria, no shortness of breath and no vomiting     Past Medical History  Diagnosis Date  . Asthma   . Hypertension    History reviewed. No pertinent past surgical history. No family history on file. History  Substance Use Topics  . Smoking status: Never Smoker   . Smokeless tobacco: Never Used  . Alcohol Use: No    Review of Systems  Constitutional: Negative for fever, chills, diaphoresis and fatigue.  HENT: Negative for congestion, rhinorrhea and sneezing.   Eyes: Negative.   Respiratory: Negative for cough, chest tightness and shortness of breath.   Cardiovascular: Negative for chest pain and leg swelling.  Gastrointestinal: Positive for nausea, abdominal pain and diarrhea (watery, non-bloody). Negative for vomiting and blood in stool.  Genitourinary: Negative  for frequency, hematuria, flank pain and difficulty urinating.  Musculoskeletal: Negative for arthralgias and back pain.  Skin: Negative for rash.  Neurological: Negative for dizziness, speech difficulty, weakness, numbness and headaches.      Allergies  Review of patient's allergies indicates no known allergies.  Home Medications   Prior to Admission medications   Medication Sig Start Date End Date Taking? Authorizing Provider  albuterol (PROVENTIL HFA;VENTOLIN HFA) 108 (90 BASE) MCG/ACT inhaler Inhale 1 puff into the lungs every 6 (six) hours as needed. For shortness of breath   Yes Historical Provider, MD  ciprofloxacin (CIPRO) 500 MG tablet Take 1 tablet (500 mg total) by mouth 2 (two) times daily. One po bid x 7 days 08/11/14   Rolan Bucco, MD  dicyclomine (BENTYL) 20 MG tablet Take 1 tablet (20 mg total) by mouth 2 (two) times daily. 08/11/14   Rolan Bucco, MD  doxycycline (VIBRAMYCIN) 100 MG capsule Take 1 capsule (100 mg total) by mouth 2 (two) times daily. One po bid x 7 days 12/13/13   April K Palumbo-Rasch, MD  lisinopril (PRINIVIL,ZESTRIL) 10 MG tablet Take 1 tablet (10 mg total) by mouth daily. 08/11/14   Rolan Bucco, MD  metroNIDAZOLE (FLAGYL) 500 MG tablet Take 1 tablet (500 mg total) by mouth 2 (two) times daily. One po bid x 7 days 08/11/14   Rolan Bucco, MD  omeprazole (PRILOSEC) 20 MG capsule Take 1 capsule (20 mg total) by mouth 2 (two) times daily. 05/23/12 05/23/13  Geoffery Lyons, MD  sucralfate (CARAFATE) 1 G tablet Take 1 tablet (1 g total) by  mouth 4 (four) times daily. 05/26/12 05/26/13  Olivia Mackie, MD   BP 172/95  Pulse 112  Temp(Src) 100.9 F (38.3 C) (Oral)  Resp 18  Ht  (1.727 m)  Wt 220 lb (99.791 kg)  BMI 33.46 kg/m2  SpO2 99% Physical Exam  Constitutional: He is oriented to person, place, and time. He appears well-developed and well-nourished.  HENT:  Head: Normocephalic and atraumatic.  Eyes: Pupils are equal, round, and reactive to light.   Neck: Normal range of motion. Neck supple.  Cardiovascular: Normal rate, regular rhythm and normal heart sounds.   Pulmonary/Chest: Effort normal and breath sounds normal. No respiratory distress. He has no wheezes. He has no rales. He exhibits no tenderness.  Abdominal: Soft. Bowel sounds are normal. There is tenderness (+TTP RLQ). There is no rebound and no guarding.  Musculoskeletal: Normal range of motion. He exhibits no edema.  Lymphadenopathy:    He has no cervical adenopathy.  Neurological: He is alert and oriented to person, place, and time.  Skin: Skin is warm and dry. No rash noted.  Psychiatric: He has a normal mood and affect.    ED Course  Procedures (including critical care time) Labs Review Results for orders placed during the hospital encounter of 08/11/14  CBC WITH DIFFERENTIAL      Result Value Ref Range   WBC 5.2  4.0 - 10.5 K/uL   RBC 5.01  4.22 - 5.81 MIL/uL   Hemoglobin 15.7  13.0 - 17.0 g/dL   HCT 40.9  81.1 - 91.4 %   MCV 88.0  78.0 - 100.0 fL   MCH 31.3  26.0 - 34.0 pg   MCHC 35.6  30.0 - 36.0 g/dL   RDW 78.2  95.6 - 21.3 %   Platelets 205  150 - 400 K/uL   Neutrophils Relative % 75  43 - 77 %   Neutro Abs 3.9  1.7 - 7.7 K/uL   Lymphocytes Relative 18  12 - 46 %   Lymphs Abs 0.9  0.7 - 4.0 K/uL   Monocytes Relative 6  3 - 12 %   Monocytes Absolute 0.3  0.1 - 1.0 K/uL   Eosinophils Relative 1  0 - 5 %   Eosinophils Absolute 0.0  0.0 - 0.7 K/uL   Basophils Relative 0  0 - 1 %   Basophils Absolute 0.0  0.0 - 0.1 K/uL  COMPREHENSIVE METABOLIC PANEL      Result Value Ref Range   Sodium 140  137 - 147 mEq/L   Potassium 4.1  3.7 - 5.3 mEq/L   Chloride 101  96 - 112 mEq/L   CO2 28  19 - 32 mEq/L   Glucose, Bld 196 (*) 70 - 99 mg/dL   BUN 7  6 - 23 mg/dL   Creatinine, Ser 0.86  0.50 - 1.35 mg/dL   Calcium 9.9  8.4 - 57.8 mg/dL   Total Protein 8.1  6.0 - 8.3 g/dL   Albumin 4.2  3.5 - 5.2 g/dL   AST 23  0 - 37 U/L   ALT 24  0 - 53 U/L   Alkaline  Phosphatase 50  39 - 117 U/L   Total Bilirubin 0.4  0.3 - 1.2 mg/dL   GFR calc non Af Amer >90  >90 mL/min   GFR calc Af Amer >90  >90 mL/min   Anion gap 11  5 - 15  URINALYSIS, ROUTINE W REFLEX MICROSCOPIC      Result  Value Ref Range   Color, Urine YELLOW  YELLOW   APPearance CLEAR  CLEAR   Specific Gravity, Urine 1.017  1.005 - 1.030   pH 6.5  5.0 - 8.0   Glucose, UA NEGATIVE  NEGATIVE mg/dL   Hgb urine dipstick NEGATIVE  NEGATIVE   Bilirubin Urine NEGATIVE  NEGATIVE   Ketones, ur NEGATIVE  NEGATIVE mg/dL   Protein, ur NEGATIVE  NEGATIVE mg/dL   Urobilinogen, UA 0.2  0.0 - 1.0 mg/dL   Nitrite NEGATIVE  NEGATIVE   Leukocytes, UA NEGATIVE  NEGATIVE   No results found.   Imaging Review Ct Abdomen Pelvis W Contrast  08/11/2014   CLINICAL DATA:  Generalized abdominal pain.  Diarrhea.  EXAM: CT ABDOMEN AND PELVIS WITH CONTRAST  TECHNIQUE: Multidetector CT imaging of the abdomen and pelvis was performed using the standard protocol following bolus administration of intravenous contrast.  CONTRAST:  OMNIPAQUE IOHEXOL 300 MG/ML  SOLN  COMPARISON:  None.  FINDINGS: There is colonic wall thickening, which is seen along the ascending colon and right transverse colon becoming less prominent along the left transverse colon and descending colon. There is hazy pericolonic fat stranding most evident adjacent to the ascending colon.  Retrocecal appendix measures 7 mm in diameter, prominent, but without associated inflammation. This is felt to be normal in this patient.  Small bowel is unremarkable.  There is several prominent lymph nodes at the root of the small bowel mesentery and along the ileo pelvic mesenteric chain, largest 1 cm short axis, all presumed to be reactive.  Clear lung bases.  Heart is normal in size.  Area of hypoattenuation along the inferior margin of the medial segment of the left liver lobe without mass effect, most likely focal fatty infiltration. Liver otherwise unremarkable.   Spleen, gallbladder, pancreas, adrenal glands, kidneys, ureters, bladder: Normal.  No ascites.  Bony structures are unremarkable.  IMPRESSION: 1. Colitis of the ascending and right transverse colon, most prominently involving the ascending colon below the hepatic flexure. This is nonspecific and may be infectious or inflammatory in etiology. There is mild associated reactive mesenteric adenopathy. No abscess, pneumatosis intestinalis or extraluminal air. 2. No other acute finding.   Electronically Signed   By: Amie Portland M.D.   On: 08/11/2014 08:35     EKG Interpretation None      MDM   Final diagnoses:  Colitis    Patient presents with right lower quadrant abdominal pain and associated diarrhea. His CT scan shows evidence of colitis there is appendix appears slightly enlarged but without evidence of infection. He has evidence of colitis which goes along with the patient's symptoms. He is comfortable appearing without a surgical abdomen. He is tolerating by mouth fluids. He was given a liter of normal saline. Given the colitis, I will prescribe him Cipro and Flagyl as well as Bentyl for the cramping. I advised him to keep an eye on the abdominal pain and if he feels like it's getting any worse to come back to the ER for recheck. I also advised him that his blood glucose was elevated and he has an appointment next week with his primary care physician with [facility] physicians. He will have it rechecked at that point. He's also out of his blood pressure medicine and I gave him a few pills until he follows up with his doctor next week.    Rolan Bucco, MD 08/11/14 [phone]

## 2014-08-11 NOTE — ED Notes (Signed)
MD at bedside. 

## 2014-08-12 ENCOUNTER — Encounter (HOSPITAL_BASED_OUTPATIENT_CLINIC_OR_DEPARTMENT_OTHER): Payer: Self-pay | Admitting: Emergency Medicine

## 2014-08-12 ENCOUNTER — Emergency Department (HOSPITAL_BASED_OUTPATIENT_CLINIC_OR_DEPARTMENT_OTHER)
Admission: EM | Admit: 2014-08-12 | Discharge: 2014-08-12 | Disposition: A | Discharge status: Home / Self Care | Payer: PRIVATE HEALTH INSURANCE | Attending: Emergency Medicine | Admitting: Emergency Medicine

## 2014-08-12 DIAGNOSIS — Z79899 Other long term (current) drug therapy: Secondary | ICD-10-CM | POA: Insufficient documentation

## 2014-08-12 DIAGNOSIS — K5289 Other specified noninfective gastroenteritis and colitis: Secondary | ICD-10-CM | POA: Diagnosis not present

## 2014-08-12 DIAGNOSIS — J45909 Unspecified asthma, uncomplicated: Secondary | ICD-10-CM | POA: Insufficient documentation

## 2014-08-12 DIAGNOSIS — Z792 Long term (current) use of antibiotics: Secondary | ICD-10-CM | POA: Insufficient documentation

## 2014-08-12 DIAGNOSIS — I10 Essential (primary) hypertension: Secondary | ICD-10-CM | POA: Diagnosis not present

## 2014-08-12 DIAGNOSIS — K529 Noninfective gastroenteritis and colitis, unspecified: Secondary | ICD-10-CM

## 2014-08-12 DIAGNOSIS — R1084 Generalized abdominal pain: Secondary | ICD-10-CM | POA: Diagnosis present

## 2014-08-12 LAB — COMPREHENSIVE METABOLIC PANEL
ALT: 16 U/L (ref 0–53)
AST: 17 U/L (ref 0–37)
Albumin: 3.7 g/dL (ref 3.5–5.2)
Alkaline Phosphatase: 44 U/L (ref 39–117)
Anion gap: 15 (ref 5–15)
BILIRUBIN TOTAL: 0.5 mg/dL (ref 0.3–1.2)
BUN: 7 mg/dL (ref 6–23)
CHLORIDE: 97 meq/L (ref 96–112)
CO2: 26 meq/L (ref 19–32)
Calcium: 9.4 mg/dL (ref 8.4–10.5)
Creatinine, Ser: 1 mg/dL (ref 0.50–1.35)
GFR calc Af Amer: >90 mL/min (ref >90)
GFR calc non Af Amer: >90 mL/min (ref >90)
Glucose, Bld: 137 mg/dL — ABNORMAL HIGH (ref 70–99)
Potassium: 3.8 mEq/L (ref 3.7–5.3)
Sodium: 138 mEq/L (ref 137–147)
Total Protein: 7.8 g/dL (ref 6.0–8.3)

## 2014-08-12 LAB — CBC
HCT: 46.4 % (ref 39.0–52.0)
Hemoglobin: 16.8 g/dL (ref 13.0–17.0)
MCH: 31.5 pg (ref 26.0–34.0)
MCHC: 36.2 g/dL — ABNORMAL HIGH (ref 30.0–36.0)
MCV: 86.9 fL (ref 78.0–100.0)
PLATELETS: 182 10*3/uL (ref 150–400)
RBC: 5.34 MIL/uL (ref 4.22–5.81)
RDW: 12.4 % (ref 11.5–15.5)
WBC: 7.2 10*3/uL (ref 4.0–10.5)

## 2014-08-12 MED ORDER — OXYCODONE-ACETAMINOPHEN 5-325 MG PO TABS: 1.0000 | ORAL_TABLET | Freq: Four times a day (QID) | ORAL | Status: DC | PRN

## 2014-08-12 MED ORDER — MORPHINE SULFATE 4 MG/ML IJ SOLN
6.0000 mg | Freq: Once | INTRAMUSCULAR | Status: AC
  Administered 2014-08-12: 6 mg via INTRAVENOUS
  Filled 2014-08-12: qty 2

## 2014-08-12 MED ORDER — SODIUM CHLORIDE 0.9 % IV BOLUS (SEPSIS)
1000.0000 mL | Freq: Once | INTRAVENOUS | Status: AC
  Administered 2014-08-12: 1000 mL via INTRAVENOUS

## 2014-08-12 NOTE — ED Notes (Signed)
Abdominal pain x 2 days. Diarrhea and fever. He was seen here yesterday am and feels worse.

## 2014-08-12 NOTE — ED Provider Notes (Addendum)
CSN: [id number]     Arrival date & time 08/12/14  1358 History   First MD Initiated Contact with Patient 08/12/14 1458     Chief Complaint  Patient presents with  . Abdominal Pain     (Consider location/radiation/quality/duration/timing/severity/associated sxs/prior Treatment) HPI Comments: Thought he had food poisoning yesterday, was seen here, had CT that showed R colon inflammation, prominent appendix at 7 mm without stranding. Given Cipro/Flagyl, bentyl, colitis precautions. Returned with persistent pain. No fevers, nausea, vomiting today.   Patient is a 28 y.o. male presenting with abdominal pain. The history is provided by the patient.  Abdominal Pain Pain location:  Generalized Pain quality: aching   Pain radiates to:  Does not radiate Pain severity:  Moderate Onset quality:  Gradual Duration:  2 days Timing:  Constant Progression:  Worsening Chronicity:  New Context: suspicious food intake   Context: not alcohol use   Relieved by:  Nothing Worsened by:  Nothing tried Associated symptoms: no chills, no cough, no fever, no shortness of breath and no vomiting     Past Medical History  Diagnosis Date  . Asthma   . Hypertension    History reviewed. No pertinent past surgical history. No family history on file. History  Substance Use Topics  . Smoking status: Never Smoker   . Smokeless tobacco: Never Used  . Alcohol Use: No    Review of Systems  Constitutional: Negative for fever and chills.  Respiratory: Negative for cough and shortness of breath.   Gastrointestinal: Negative for vomiting and abdominal pain.  All other systems reviewed and are negative.     Allergies  Review of patient's allergies indicates no known allergies.  Home Medications   Prior to Admission medications   Medication Sig Start Date End Date Taking? Authorizing Provider  albuterol (PROVENTIL HFA;VENTOLIN HFA) 108 (90 BASE) MCG/ACT inhaler Inhale 1 puff into the lungs every 6 (six)  hours as needed. For shortness of breath    Historical Provider, MD  ciprofloxacin (CIPRO) 500 MG tablet Take 1 tablet (500 mg total) by mouth 2 (two) times daily. One po bid x 7 days 08/11/14   Rolan Bucco, MD  dicyclomine (BENTYL) 20 MG tablet Take 1 tablet (20 mg total) by mouth 2 (two) times daily. 08/11/14   Rolan Bucco, MD  doxycycline (VIBRAMYCIN) 100 MG capsule Take 1 capsule (100 mg total) by mouth 2 (two) times daily. One po bid x 7 days 12/13/13   April K Palumbo-Rasch, MD  lisinopril (PRINIVIL,ZESTRIL) 10 MG tablet Take 1 tablet (10 mg total) by mouth daily. 08/11/14   Rolan Bucco, MD  metroNIDAZOLE (FLAGYL) 500 MG tablet Take 1 tablet (500 mg total) by mouth 2 (two) times daily. One po bid x 7 days 08/11/14   Rolan Bucco, MD  omeprazole (PRILOSEC) 20 MG capsule Take 1 capsule (20 mg total) by mouth 2 (two) times daily. 05/23/12 05/23/13  Geoffery Lyons, MD  sucralfate (CARAFATE) 1 G tablet Take 1 tablet (1 g total) by mouth 4 (four) times daily. 05/26/12 05/26/13  Olivia Mackie, MD   BP 160/90  Pulse 108  Temp(Src) 99.1 F (37.3 C) (Oral)  Resp 18  Ht  (1.727 m)  Wt 220 lb (99.791 kg)  BMI 33.46 kg/m2  SpO2 100% Physical Exam  Nursing note and vitals reviewed. Constitutional: He is oriented to person, place, and time. He appears well-developed and well-nourished. No distress.  HENT:  Head: Normocephalic and atraumatic.  Mouth/Throat: Oropharynx is clear and moist.  No oropharyngeal exudate.  Eyes: EOM are normal. Pupils are equal, round, and reactive to light.  Neck: Normal range of motion. Neck supple.  Cardiovascular: Normal rate and regular rhythm.  Exam reveals no friction rub.   No murmur heard. Pulmonary/Chest: Effort normal and breath sounds normal. No respiratory distress. He has no wheezes. He has no rales.  Abdominal: Soft. He exhibits no distension. There is no tenderness. There is no rebound.  Musculoskeletal: Normal range of motion. He exhibits no edema.   Neurological: He is alert and oriented to person, place, and time. No cranial nerve deficit. He exhibits normal muscle tone. Coordination normal.  Skin: No rash noted. He is not diaphoretic.    ED Course  Procedures (including critical care time) Labs Review Labs Reviewed  CBC  COMPREHENSIVE METABOLIC PANEL    Imaging Review Ct Abdomen Pelvis W Contrast  08/11/2014   CLINICAL DATA:  Generalized abdominal pain.  Diarrhea.  EXAM: CT ABDOMEN AND PELVIS WITH CONTRAST  TECHNIQUE: Multidetector CT imaging of the abdomen and pelvis was performed using the standard protocol following bolus administration of intravenous contrast.  CONTRAST:  OMNIPAQUE IOHEXOL 300 MG/ML  SOLN  COMPARISON:  None.  FINDINGS: There is colonic wall thickening, which is seen along the ascending colon and right transverse colon becoming less prominent along the left transverse colon and descending colon. There is hazy pericolonic fat stranding most evident adjacent to the ascending colon.  Retrocecal appendix measures 7 mm in diameter, prominent, but without associated inflammation. This is felt to be normal in this patient.  Small bowel is unremarkable.  There is several prominent lymph nodes at the root of the small bowel mesentery and along the ileo pelvic mesenteric chain, largest 1 cm short axis, all presumed to be reactive.  Clear lung bases.  Heart is normal in size.  Area of hypoattenuation along the inferior margin of the medial segment of the left liver lobe without mass effect, most likely focal fatty infiltration. Liver otherwise unremarkable.  Spleen, gallbladder, pancreas, adrenal glands, kidneys, ureters, bladder: Normal.  No ascites.  Bony structures are unremarkable.  IMPRESSION: 1. Colitis of the ascending and right transverse colon, most prominently involving the ascending colon below the hepatic flexure. This is nonspecific and may be infectious or inflammatory in etiology. There is mild associated reactive  mesenteric adenopathy. No abscess, pneumatosis intestinalis or extraluminal air. 2. No other acute finding.   Electronically Signed   By: Amie Portland M.D.   On: 08/11/2014 08:35     EKG Interpretation None      MDM   Final diagnoses:  Colitis    Thought he had food poisoning yesterday, was seen here, had CT that showed R colon inflammation, prominent appendix at 7 mm without stranding. Given Cipro/Flagyl, bentyl, colitis precautions. Returned with persistent pain. No fevers, nausea, vomiting today.  Belly exam with mild diffuse tenderness, no peritoneal signs or guarding.  Labs okay. Feeling better after morphine and fluids. I spoke with Dr. Derrell Lolling with surgery who reviewed the scan. He does not feel this is surgical, however he is concerned with his degree of colitis and recommended admission for medicine evaluation and GI evaluation. I discussed this with the patient. He states he rather go home as he has followup with [facility] family practice next week. With stable vitals normal labs, I feel this is reasonable. Given pain medicine. Stable for discharge  Elwin Mocha, MD 08/12/14 [id number]  Elwin Mocha, MD 08/12/14 [phone]

## 2014-08-12 NOTE — Discharge Instructions (Signed)

## 2015-05-18 ENCOUNTER — Emergency Department (HOSPITAL_BASED_OUTPATIENT_CLINIC_OR_DEPARTMENT_OTHER): Payer: No Typology Code available for payment source

## 2015-05-18 ENCOUNTER — Encounter (HOSPITAL_BASED_OUTPATIENT_CLINIC_OR_DEPARTMENT_OTHER): Payer: Self-pay | Admitting: *Deleted

## 2015-05-18 ENCOUNTER — Emergency Department (HOSPITAL_BASED_OUTPATIENT_CLINIC_OR_DEPARTMENT_OTHER)
Admission: EM | Admit: 2015-05-18 | Discharge: 2015-05-18 | Disposition: A | Discharge status: Home / Self Care | Payer: No Typology Code available for payment source | Attending: Emergency Medicine | Admitting: Emergency Medicine

## 2015-05-18 DIAGNOSIS — I10 Essential (primary) hypertension: Secondary | ICD-10-CM | POA: Insufficient documentation

## 2015-05-18 DIAGNOSIS — J45901 Unspecified asthma with (acute) exacerbation: Secondary | ICD-10-CM | POA: Insufficient documentation

## 2015-05-18 DIAGNOSIS — Z79899 Other long term (current) drug therapy: Secondary | ICD-10-CM | POA: Insufficient documentation

## 2015-05-18 DIAGNOSIS — J189 Pneumonia, unspecified organism: Secondary | ICD-10-CM

## 2015-05-18 DIAGNOSIS — R0602 Shortness of breath: Secondary | ICD-10-CM

## 2015-05-18 DIAGNOSIS — J159 Unspecified bacterial pneumonia: Secondary | ICD-10-CM | POA: Diagnosis not present

## 2015-05-18 DIAGNOSIS — R000 Tachycardia, unspecified: Secondary | ICD-10-CM | POA: Diagnosis not present

## 2015-05-18 LAB — CBC WITH DIFFERENTIAL/PLATELET
BASOS ABS: 0 10*3/uL (ref 0.0–0.1)
BASOS PCT: 0 % (ref 0–1)
Eosinophils Absolute: 0 10*3/uL (ref 0.0–0.7)
Eosinophils Relative: 0 % (ref 0–5)
HEMATOCRIT: 43.2 % (ref 39.0–52.0)
Hemoglobin: 14.4 g/dL (ref 13.0–17.0)
Lymphocytes Relative: 8 % — ABNORMAL LOW (ref 12–46)
Lymphs Abs: 0.6 10*3/uL — ABNORMAL LOW (ref 0.7–4.0)
MCH: 29 pg (ref 26.0–34.0)
MCHC: 33.3 g/dL (ref 30.0–36.0)
MCV: 86.9 fL (ref 78.0–100.0)
MONOS PCT: 11 % (ref 3–12)
Monocytes Absolute: 0.8 10*3/uL (ref 0.1–1.0)
NEUTROS ABS: 5.4 10*3/uL (ref 1.7–7.7)
NEUTROS PCT: 81 % — AB (ref 43–77)
Platelets: 304 10*3/uL (ref 150–400)
RBC: 4.97 MIL/uL (ref 4.22–5.81)
RDW: 12.5 % (ref 11.5–15.5)
WBC: 6.8 10*3/uL (ref 4.0–10.5)

## 2015-05-18 LAB — BASIC METABOLIC PANEL
Anion gap: 10 (ref 5–15)
BUN: 11 mg/dL (ref 6–20)
CHLORIDE: 98 mmol/L — AB (ref 101–111)
CO2: 29 mmol/L (ref 22–32)
Calcium: 9 mg/dL (ref 8.9–10.3)
Creatinine, Ser: 0.9 mg/dL (ref 0.61–1.24)
GFR calc Af Amer: >60 mL/min (ref >60)
GFR calc non Af Amer: >60 mL/min (ref >60)
Glucose, Bld: 158 mg/dL — ABNORMAL HIGH (ref 65–99)
Potassium: 3.9 mmol/L (ref 3.5–5.1)
Sodium: 137 mmol/L (ref 135–145)

## 2015-05-18 MED ORDER — METHYLPREDNISOLONE SODIUM SUCC 125 MG IJ SOLR: 125.0000 mg | Freq: Once | INTRAMUSCULAR | Status: DC

## 2015-05-18 MED ORDER — IPRATROPIUM-ALBUTEROL 0.5-2.5 (3) MG/3ML IN SOLN
RESPIRATORY_TRACT | Status: AC
  Administered 2015-05-18: 3 mL via RESPIRATORY_TRACT
  Filled 2015-05-18: qty 3

## 2015-05-18 MED ORDER — SODIUM CHLORIDE 0.9 % IV SOLN: INTRAVENOUS | Status: DC

## 2015-05-18 MED ORDER — SODIUM CHLORIDE 0.9 % IV BOLUS (SEPSIS): 1000.0000 mL | Freq: Once | INTRAVENOUS | Status: DC

## 2015-05-18 MED ORDER — CEFTRIAXONE SODIUM 1 G IJ SOLR
1.0000 g | Freq: Once | INTRAMUSCULAR | Status: AC
  Administered 2015-05-18: 1 g via INTRAMUSCULAR
  Filled 2015-05-18: qty 10

## 2015-05-18 MED ORDER — AZITHROMYCIN 250 MG PO TABS
500.0000 mg | ORAL_TABLET | Freq: Once | ORAL | Status: AC
  Administered 2015-05-18: 500 mg via ORAL
  Filled 2015-05-18: qty 2

## 2015-05-18 MED ORDER — METHYLPREDNISOLONE SODIUM SUCC 125 MG IJ SOLR
125.0000 mg | Freq: Once | INTRAMUSCULAR | Status: AC
  Administered 2015-05-18: 125 mg via INTRAMUSCULAR
  Filled 2015-05-18: qty 2

## 2015-05-18 MED ORDER — PREDNISONE 10 MG PO TABS: 40.0000 mg | ORAL_TABLET | Freq: Every day | ORAL | Status: DC

## 2015-05-18 MED ORDER — IPRATROPIUM-ALBUTEROL 0.5-2.5 (3) MG/3ML IN SOLN
3.0000 mL | Freq: Once | RESPIRATORY_TRACT | Status: AC
  Administered 2015-05-18: 3 mL via RESPIRATORY_TRACT

## 2015-05-18 MED ORDER — LIDOCAINE HCL (PF) 1 % IJ SOLN
INTRAMUSCULAR | Status: AC
  Filled 2015-05-18: qty 5

## 2015-05-18 MED ORDER — SODIUM CHLORIDE 0.9 % IV BOLUS (SEPSIS): 250.0000 mL | Freq: Once | INTRAVENOUS | Status: DC

## 2015-05-18 MED ORDER — AZITHROMYCIN 250 MG PO TABS
250.0000 mg | ORAL_TABLET | Freq: Every day | ORAL | Status: DC
Start: 2015-05-18 — End: 2015-09-08

## 2015-05-18 MED ORDER — IPRATROPIUM-ALBUTEROL 0.5-2.5 (3) MG/3ML IN SOLN
3.0000 mL | RESPIRATORY_TRACT | Status: DC
  Administered 2015-05-18: 3 mL via RESPIRATORY_TRACT
  Filled 2015-05-18: qty 3

## 2015-05-18 NOTE — ED Notes (Signed)
Pt amb to triage with quick steady gait in nad. Pt reports sob increasing x yesterday. Dx with pneumonia last week. Rt at triage for assessment. resp easy and reg, pt is in nad.

## 2015-05-18 NOTE — Discharge Instructions (Signed)
Continue take the antibody Zithromax as directed. Use your albuterol inhaler 2 puffs every 6 hours for the next 7 days. Take prednisone as directed for the next 5 days. Return for any new or worse symptoms or if not improving in 2 days.

## 2015-05-18 NOTE — ED Notes (Signed)
RRT at bedside for evaluation.  Pt diaphoretic, skin warm and he appears tachypneic.

## 2015-05-18 NOTE — ED Notes (Addendum)
With US blood draw was obtained but IV insertion was still unsuccessful. Discussed with Dr. Deretha EmoryZackowski who states IV is not necessary and Solu-medrol may be given IM.

## 2015-05-18 NOTE — ED Notes (Addendum)
2 unsuccesful attempts at IV insertion. US to be used next.

## 2015-05-18 NOTE — ED Provider Notes (Signed)
CSN: [id number]     Arrival date & time 05/18/15  1724 History  This chart was scribed for Leroy Mulders, MD by Elon Spanner, ED Scribe. This patient was seen in room MH12/MH12 and the patient's care was started at 6:24 PM.   Chief Complaint  Patient presents with  . Shortness of Breath   Patient is a 29 y.o. male presenting with shortness of breath. The history is provided by the patient. No language interpreter was used.  Shortness of Breath Severity:  Mild Onset quality:  Gradual Duration:  3 days Progression:  Unchanged Chronicity:  New Context: activity   Relieved by:  Inhaler Worsened by:  Activity Associated symptoms: chest pain, cough and wheezing   Associated symptoms: no abdominal pain, no fever, no headaches, no neck pain, no rash and no sore throat    HPI Comments: Leroy Alvarado is a 29 y.o. male with a history of asthma as a child.  Per patient, he was diagnosed with pneumonia 1 week ago after seeking treatment at an urgent care for cough and fever.  There, he received a nebulizer treatment and was prescribed an albuterol inhaler, cough syrup, and prednisone but no antibioitic.  He presents to the Emergency Department complaining of exertional SOB onset 3 days ago with associated wheezing in the ED.  His fever resolved two days ago.  He has had a nebulizer treatment in the ED with improvement.    PCP: none  Past Medical History  Diagnosis Date  . Asthma   . Hypertension    History reviewed. No pertinent past surgical history. History reviewed. No pertinent family history. History  Substance Use Topics  . Smoking status: Never Smoker   . Smokeless tobacco: Never Used  . Alcohol Use: No    Review of Systems  Constitutional: Negative for fever and chills.  HENT: Negative for rhinorrhea and sore throat.   Eyes: Negative for visual disturbance.  Respiratory: Positive for cough, shortness of breath and wheezing.   Cardiovascular: Positive for chest pain. Negative  for leg swelling.  Gastrointestinal: Negative for abdominal pain.  Genitourinary: Negative for dysuria and hematuria.  Musculoskeletal: Negative for back pain and neck pain.  Skin: Negative for rash.  Neurological: Negative for headaches.  Hematological: Does not bruise/bleed easily.  Psychiatric/Behavioral: Negative for confusion.      Allergies  Review of patient's allergies indicates no known allergies.  Home Medications   Prior to Admission medications   Medication Sig Start Date End Date Taking? Authorizing Provider  albuterol (PROVENTIL HFA;VENTOLIN HFA) 108 (90 BASE) MCG/ACT inhaler Inhale 1 puff into the lungs every 6 (six) hours as needed. For shortness of breath    Historical Provider, MD  azithromycin (ZITHROMAX) 250 MG tablet Take 1 tablet (250 mg total) by mouth daily. Take first 2 tablets together, then 1 every day until finished. 05/18/15   Leroy Mulders, MD  lisinopril (PRINIVIL,ZESTRIL) 10 MG tablet Take 1 tablet (10 mg total) by mouth daily. 08/11/14   Leroy Bucco, MD  omeprazole (PRILOSEC) 20 MG capsule Take 1 capsule (20 mg total) by mouth 2 (two) times daily. 05/23/12 05/23/13  Geoffery Lyons, MD  predniSONE (DELTASONE) 10 MG tablet Take 4 tablets (40 mg total) by mouth daily. 05/18/15   Leroy Mulders, MD   BP 164/112 mmHg  Pulse 131  Temp(Src) 100 F (37.8 C) (Oral)  Resp 20  Ht  (1.778 m)  Wt 189 lb (85.73 kg)  BMI 27.12 kg/m2  SpO2 97% Physical Exam  Constitutional: He is oriented to person, place, and time. He appears well-developed and well-nourished. No distress.  HENT:  Head: Normocephalic and atraumatic.  White coating on tongue that goes along with virus.  Mucous membranes dry.    Eyes: Conjunctivae and EOM are normal.  Sclera clear.  Pupils normal.  Eyes tracking normal.   Neck: Neck supple. No tracheal deviation present.  Cardiovascular: Normal rate, regular rhythm and normal heart sounds.   No murmurs.  Tachycardic.    Pulmonary/Chest:  Effort normal. No respiratory distress.  Some faint bilateral wheezing.    Abdominal: Bowel sounds are normal. There is no tenderness.  Musculoskeletal: Normal range of motion. He exhibits no edema.  No swelling in the ankles.    Neurological: He is alert and oriented to person, place, and time. No cranial nerve deficit. He exhibits normal muscle tone. Coordination normal.  Skin: Skin is warm and dry.  Psychiatric: He has a normal mood and affect. His behavior is normal.  Nursing note and vitals reviewed.   ED Course  Procedures (including critical care time)  DIAGNOSTIC STUDIES: Oxygen Saturation is 99% on 2 L/min, normal by my interpretation.    COORDINATION OF CARE:  6:45 PM Informed patient of findings of CXR.  Will order CT Scan and solumedrol.  The patient reiterates that his SOB has improved after his nebulizer treatment.  Patient acknowledges and agrees with plan.    Labs Review Labs Reviewed  CBC WITH DIFFERENTIAL/PLATELET - Abnormal; Notable for the following:    Neutrophils Relative % 81 (*)    Lymphocytes Relative 8 (*)    Lymphs Abs 0.6 (*)    All other components within normal limits  BASIC METABOLIC PANEL - Abnormal; Notable for the following:    Chloride 98 (*)    Glucose, Bld 158 (*)    All other components within normal limits   Results for orders placed or performed during the hospital encounter of 05/18/15  CBC with Differential/Platelet  Result Value Ref Range   WBC 6.8 4.0 - 10.5 K/uL   RBC 4.97 4.22 - 5.81 MIL/uL   Hemoglobin 14.4 13.0 - 17.0 g/dL   HCT 69.643.2 29.539.0 - 28.452.0 %   MCV 86.9 78.0 - 100.0 fL   MCH 29.0 26.0 - 34.0 pg   MCHC 33.3 30.0 - 36.0 g/dL   RDW 13.212.5 44.011.5 - 10.215.5 %   Platelets 304 150 - 400 K/uL   Neutrophils Relative % 81 (H) 43 - 77 %   Neutro Abs 5.4 1.7 - 7.7 K/uL   Lymphocytes Relative 8 (L) 12 - 46 %   Lymphs Abs 0.6 (L) 0.7 - 4.0 K/uL   Monocytes Relative 11 3 - 12 %   Monocytes Absolute 0.8 0.1 - 1.0 K/uL   Eosinophils  Relative 0 0 - 5 %   Eosinophils Absolute 0.0 0.0 - 0.7 K/uL   Basophils Relative 0 0 - 1 %   Basophils Absolute 0.0 0.0 - 0.1 K/uL  Basic metabolic panel  Result Value Ref Range   Sodium 137 135 - 145 mmol/L   Potassium 3.9 3.5 - 5.1 mmol/L   Chloride 98 (L) 101 - 111 mmol/L   CO2 29 22 - 32 mmol/L   Glucose, Bld 158 (H) 65 - 99 mg/dL   BUN 11 6 - 20 mg/dL   Creatinine, Ser 7.250.90 0.61 - 1.24 mg/dL   Calcium 9.0 8.9 - 36.610.3 mg/dL   GFR calc non Af Amer >60 >60 mL/min  GFR calc Af Amer >60 >60 mL/min   Anion gap 10 5 - 15     Imaging Review Dg Chest 2 View  05/18/2015   CLINICAL DATA:  Shortness of breath for 1 week. Increasing symptoms over the last 2 days. History of hypertension and asthma. Nonsmoker. Initial encounter.  EXAM: CHEST  2 VIEW  COMPARISON:  05/26/2012 and 05/23/2012.  FINDINGS: There are significantly lower lung volumes with new patchy bibasilar airspace opacities. The pulmonary vascularity remains normal. There is no pleural effusion, pneumothorax or consolidation. Allowing for the lower lung volumes, the heart size and mediastinal contours are stable. The bones appear unchanged.  IMPRESSION: Significantly lower lung volumes with new patchy bibasilar airspace opacities. These may reflect atelectasis or early pneumonia.   Electronically Signed   By: Carey Bullocks M.D.   On: 05/18/2015 18:32   Ct Chest Wo Contrast  05/18/2015   CLINICAL DATA:  Pneumonia.  Shortness of breath for 3 days.  EXAM: CT CHEST WITHOUT CONTRAST  TECHNIQUE: Multidetector CT imaging of the chest was performed following the standard protocol without IV contrast.  COMPARISON:  None  FINDINGS: Mediastinum: Normal heart size. No pericardial effusion. The trachea is patent and is midline. There is mild circumferential wall thickening involving the esophagus. No axillary or supraclavicular adenopathy. No mediastinal or hilar adenopathy.  Lungs/Pleura: Diffuse, heterogeneous ground-glass attenuation is  identified throughout both lungs and has a basilar predominance. Lower lobe areas of subsegmental atelectasis are also noted. No focal airspace consolidation identified.  Upper Abdomen: The visualized portions of the liver and spleen are normal. The adrenal glands are unremarkable.  Musculoskeletal: There are no aggressive lytic or sclerotic bone lesions. No acute bone abnormality noted.  IMPRESSION: 1. Diffuse bilateral and heterogeneous ground-glass attenuation spot. In the setting of pneumonia findings are favored to represent atypical infection. No focal areas of lobar consolidation identified. 2. Diffuse circumferential wall thickening of the esophagus. Correlation for any clinical signs or symptoms of esophagitis recommended.   Electronically Signed   By: Signa Kell M.D.   On: 05/18/2015 19:29     EKG Interpretation None      MDM   Final diagnoses:  SOB (shortness of breath)  CAP (community acquired pneumonia)  Asthma, unspecified asthma severity, with acute exacerbation    Patient presented with acute exacerbation of asthma. Patient with history of asthma as a child but not lately. Wheezing resolved after 2 nebulizer treatments. Patient also given IM site Medrol. Chest x-ray raises questions of pneumonia. CT of the chest seemed to confirm an atypical appearing pneumonia. Patient received IM Rocephin and by mouth Zithromax. Patient now significantly improved. Patient nontoxic no acute distress no hypoxia. Patient with a little bit of a low-grade fever 100.2. Patient will be continued on Zithromax as an outpatient we continued on prednisone. Patient has an albuterol inhaler and will continue to use it 2 puffs every 6 hours for the next 7 days. Prednisone will be continued for 5 days. Patient will return if not improving in 2 days.   I personally performed the services described in this documentation, which was scribed in my presence. The recorded information has been reviewed and is  accurate.   and  Leroy Mulders, MD 05/18/15 2132

## 2015-05-18 NOTE — ED Notes (Signed)
RRT at bedside for neb tx

## 2015-09-06 ENCOUNTER — Emergency Department: Payer: PRIVATE HEALTH INSURANCE

## 2015-09-06 ENCOUNTER — Encounter: Payer: Self-pay | Admitting: Medical Oncology

## 2015-09-06 ENCOUNTER — Inpatient Hospital Stay
Admission: EM | Admit: 2015-09-06 | Discharge: 2015-09-08 | DRG: 975 | Disposition: A | Discharge status: Home / Self Care | Payer: PRIVATE HEALTH INSURANCE | Attending: Internal Medicine | Admitting: Internal Medicine

## 2015-09-06 DIAGNOSIS — J45901 Unspecified asthma with (acute) exacerbation: Secondary | ICD-10-CM | POA: Diagnosis present

## 2015-09-06 DIAGNOSIS — I10 Essential (primary) hypertension: Secondary | ICD-10-CM | POA: Diagnosis present

## 2015-09-06 DIAGNOSIS — Z79899 Other long term (current) drug therapy: Secondary | ICD-10-CM

## 2015-09-06 DIAGNOSIS — B20 Human immunodeficiency virus [HIV] disease: Secondary | ICD-10-CM | POA: Diagnosis present

## 2015-09-06 DIAGNOSIS — A419 Sepsis, unspecified organism: Secondary | ICD-10-CM | POA: Diagnosis present

## 2015-09-06 DIAGNOSIS — B370 Candidal stomatitis: Secondary | ICD-10-CM

## 2015-09-06 DIAGNOSIS — R6510 Systemic inflammatory response syndrome (SIRS) of non-infectious origin without acute organ dysfunction: Secondary | ICD-10-CM

## 2015-09-06 DIAGNOSIS — Z7952 Long term (current) use of systemic steroids: Secondary | ICD-10-CM

## 2015-09-06 DIAGNOSIS — Z792 Long term (current) use of antibiotics: Secondary | ICD-10-CM | POA: Diagnosis not present

## 2015-09-06 DIAGNOSIS — Z91048 Other nonmedicinal substance allergy status: Secondary | ICD-10-CM | POA: Diagnosis not present

## 2015-09-06 DIAGNOSIS — J189 Pneumonia, unspecified organism: Secondary | ICD-10-CM | POA: Diagnosis present

## 2015-09-06 DIAGNOSIS — B59 Pneumocystosis: Secondary | ICD-10-CM | POA: Diagnosis present

## 2015-09-06 DIAGNOSIS — Z880 Allergy status to penicillin: Secondary | ICD-10-CM | POA: Diagnosis not present

## 2015-09-06 DIAGNOSIS — R0902 Hypoxemia: Secondary | ICD-10-CM | POA: Diagnosis present

## 2015-09-06 HISTORY — DX: Asymptomatic human immunodeficiency virus (hiv) infection status: Z21

## 2015-09-06 HISTORY — DX: Human immunodeficiency virus (HIV) disease: B20

## 2015-09-06 LAB — CBC
HEMATOCRIT: 38.1 % — AB (ref 40.0–52.0)
HEMOGLOBIN: 12.9 g/dL — AB (ref 13.0–18.0)
MCH: 30.1 pg (ref 26.0–34.0)
MCHC: 33.9 g/dL (ref 32.0–36.0)
MCV: 88.9 fL (ref 80.0–100.0)
Platelets: 201 10*3/uL (ref 150–440)
RBC: 4.29 MIL/uL — AB (ref 4.40–5.90)
RDW: 16.2 % — ABNORMAL HIGH (ref 11.5–14.5)
WBC: 5.4 10*3/uL (ref 3.8–10.6)

## 2015-09-06 LAB — COMPREHENSIVE METABOLIC PANEL
ALT: 13 U/L — AB (ref 17–63)
AST: 32 U/L (ref 15–41)
Albumin: 4.2 g/dL (ref 3.5–5.0)
Alkaline Phosphatase: 59 U/L (ref 38–126)
Anion gap: 10 (ref 5–15)
BILIRUBIN TOTAL: 0.7 mg/dL (ref 0.3–1.2)
BUN: 7 mg/dL (ref 6–20)
CO2: 25 mmol/L (ref 22–32)
CREATININE: 0.77 mg/dL (ref 0.61–1.24)
Calcium: 9.4 mg/dL (ref 8.9–10.3)
Chloride: 105 mmol/L (ref 101–111)
Glucose, Bld: 156 mg/dL — ABNORMAL HIGH (ref 65–99)
Potassium: 4.1 mmol/L (ref 3.5–5.1)
Sodium: 140 mmol/L (ref 135–145)
TOTAL PROTEIN: 7.8 g/dL (ref 6.5–8.1)

## 2015-09-06 LAB — LACTIC ACID, PLASMA
LACTIC ACID, VENOUS: 1.6 mmol/L (ref 0.5–2.0)
Lactic Acid, Venous: 1.1 mmol/L (ref 0.5–2.0)

## 2015-09-06 LAB — BLOOD GAS, VENOUS
ACID-BASE EXCESS: 3.4 mmol/L — AB (ref 0.0–3.0)
BICARBONATE: 29.6 meq/L — AB (ref 21.0–28.0)
PATIENT TEMPERATURE: 37
PCO2 VEN: 50 mmHg (ref 44.0–60.0)
PH VEN: 7.38 (ref 7.320–7.430)

## 2015-09-06 LAB — BRAIN NATRIURETIC PEPTIDE: B NATRIURETIC PEPTIDE 5: 12 pg/mL (ref 0.0–100.0)

## 2015-09-06 LAB — TROPONIN I: Troponin I: <0.03 ng/mL (ref <0.031)

## 2015-09-06 MED ORDER — SODIUM CHLORIDE 0.9 % IV SOLN
Freq: Once | INTRAVENOUS | Status: AC
  Administered 2015-09-06: 14:00:00 via INTRAVENOUS

## 2015-09-06 MED ORDER — LEVOFLOXACIN IN D5W 750 MG/150ML IV SOLN
750.0000 mg | INTRAVENOUS | Status: DC
  Administered 2015-09-07: 750 mg via INTRAVENOUS
  Filled 2015-09-06 (×2): qty 150

## 2015-09-06 MED ORDER — SODIUM CHLORIDE 0.9 % IV SOLN
INTRAVENOUS | Status: DC
  Administered 2015-09-06 – 2015-09-08 (×3): via INTRAVENOUS

## 2015-09-06 MED ORDER — SODIUM CHLORIDE 0.9 % IJ SOLN: 3.0000 mL | INTRAMUSCULAR | Status: DC | PRN

## 2015-09-06 MED ORDER — INFLUENZA VAC SPLIT QUAD 0.5 ML IM SUSY
0.5000 mL | PREFILLED_SYRINGE | INTRAMUSCULAR | Status: DC
  Filled 2015-09-06: qty 0.5

## 2015-09-06 MED ORDER — SODIUM CHLORIDE 0.9 % IJ SOLN: 3.0000 mL | Freq: Two times a day (BID) | INTRAMUSCULAR | Status: DC

## 2015-09-06 MED ORDER — LISINOPRIL 20 MG PO TABS
20.0000 mg | ORAL_TABLET | Freq: Every day | ORAL | Status: DC
  Administered 2015-09-07 – 2015-09-08 (×2): 20 mg via ORAL
  Filled 2015-09-06 (×2): qty 1

## 2015-09-06 MED ORDER — PANTOPRAZOLE SODIUM 40 MG PO TBEC
40.0000 mg | DELAYED_RELEASE_TABLET | Freq: Every day | ORAL | Status: DC
  Administered 2015-09-06 – 2015-09-08 (×3): 40 mg via ORAL
  Filled 2015-09-06 (×3): qty 1

## 2015-09-06 MED ORDER — HYDRALAZINE HCL 20 MG/ML IJ SOLN
10.0000 mg | Freq: Four times a day (QID) | INTRAMUSCULAR | Status: DC | PRN
  Administered 2015-09-06: 10 mg via INTRAVENOUS
  Filled 2015-09-06: qty 1

## 2015-09-06 MED ORDER — ENOXAPARIN SODIUM 40 MG/0.4ML ~~LOC~~ SOLN
40.0000 mg | SUBCUTANEOUS | Status: DC
  Filled 2015-09-06 (×2): qty 0.4

## 2015-09-06 MED ORDER — LEVOFLOXACIN IN D5W 750 MG/150ML IV SOLN
750.0000 mg | Freq: Once | INTRAVENOUS | Status: AC
  Administered 2015-09-06: 750 mg via INTRAVENOUS
  Filled 2015-09-06: qty 150

## 2015-09-06 MED ORDER — ACETAMINOPHEN 325 MG PO TABS
650.0000 mg | ORAL_TABLET | Freq: Four times a day (QID) | ORAL | Status: DC | PRN
  Administered 2015-09-06 – 2015-09-07 (×3): 650 mg via ORAL
  Filled 2015-09-06 (×4): qty 2

## 2015-09-06 MED ORDER — SODIUM CHLORIDE 0.9 % IV SOLN: 250.0000 mL | INTRAVENOUS | Status: DC | PRN

## 2015-09-06 MED ORDER — ALBUTEROL SULFATE (2.5 MG/3ML) 0.083% IN NEBU
2.5000 mg | INHALATION_SOLUTION | Freq: Four times a day (QID) | RESPIRATORY_TRACT | Status: DC | PRN
  Administered 2015-09-06: 2.5 mg via RESPIRATORY_TRACT
  Filled 2015-09-06: qty 3

## 2015-09-06 NOTE — H&P (Addendum)
[facility] - [place] at [facility]   PATIENT NAME: Leroy Alvarado    MR#:  [id number]  DATE OF BIRTH:  1986/02/09  DATE OF ADMISSION:  09/06/2015  PRIMARY CARE PHYSICIAN: No PCP Per Patient   REQUESTING/REFERRING PHYSICIAN: Emily Filbert, MD  CHIEF COMPLAINT:   Chief Complaint  Patient presents with  . Shortness of Breath   shortness of breath today.  HISTORY OF PRESENT ILLNESS:  Leroy Alvarado  is a 29 y.o. male with history of hypertension and recent diagnosis of asthma. He started to have shortness of breath and a cough today. He used the inhaler without improvement. He was found hypoxia with O2 saturation at 86 and tachycardia at 120 to 150's in the ED. He is treated with Levaquin in the ED. 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  PAST MEDICAL HISTORY:   Past Medical History  Diagnosis Date  . Asthma   . Hypertension     PAST SURGICAL HISTORY:  History reviewed. No pertinent past surgical history.  SOCIAL HISTORY:   Social History  Substance Use Topics  . Smoking status: Never Smoker   . Smokeless tobacco: Never Used  . Alcohol Use: No    FAMILY HISTORY:  No family history on file. hypertension.  DRUG ALLERGIES:   Allergies  Allergen Reactions  . Mushroom Extract Complex Anaphylaxis  . Penicillins Anaphylaxis    REVIEW OF SYSTEMS:  CONSTITUTIONAL: No fever, fatigue or weakness.  EYES: No blurred or double vision.  EARS, NOSE, AND THROAT: No tinnitus or ear pain.  RESPIRATORY:  has  cough, shortness of breath, no  wheezing or hemoptysis.  CARDIOVASCULAR: No chest pain, orthopnea, edema.  GASTROINTESTINAL: No nausea, vomiting, diarrhea or abdominal pain.  GENITOURINARY: No dysuria, hematuria.  ENDOCRINE: No polyuria, nocturia,  HEMATOLOGY: No anemia, easy bruising or bleeding SKIN: No rash or  lesion. MUSCULOSKELETAL: No joint pain or arthritis.   NEUROLOGIC: No tingling, numbness, weakness.  PSYCHIATRY: No anxiety or depression.   MEDICATIONS AT HOME:   Prior to Admission medications   Medication Sig Start Date End Date Taking? Authorizing Provider  albuterol (PROVENTIL HFA;VENTOLIN HFA) 108 (90 BASE) MCG/ACT inhaler Inhale 1 puff into the lungs every 6 (six) hours as needed for shortness of breath.    Yes Historical Provider, MD  lisinopril (PRINIVIL,ZESTRIL) 20 MG tablet Take 20 mg by mouth daily.   Yes Historical Provider, MD  azithromycin (ZITHROMAX) 250 MG tablet Take 1 tablet (250 mg total) by mouth daily. Take first 2 tablets together, then 1 every day until finished. Patient not taking: Reported on 09/06/2015 05/18/15   Vanetta Mulders, MD  predniSONE (DELTASONE) 10 MG tablet Take 4 tablets (40 mg total) by mouth daily. Patient not taking: Reported on 09/06/2015 05/18/15   Vanetta Mulders, MD      VITAL SIGNS:  Blood pressure 160/110, pulse 124, temperature 100.7 F (38.2 C), temperature source Oral, resp. rate 21, height  (1.753 m), weight 80.287 kg (177 lb), SpO2 95 %.  PHYSICAL EXAMINATION:  GENERAL:  29 y.o.-year-old patient lying in the bed with no acute distress.  EYES: Pupils equal, round, reactive to light and accommodation. No scleral icterus. Extraocular muscles intact.  HEENT: Head atraumatic, normocephalic. Oropharynx and nasopharynx clear.  NECK:  Supple, no jugular venous distention. No thyroid enlargement, no tenderness.  LUNGS:very diminished breath sounds bilaterally,  no wheezing, rales,rhonchi or crepitation. No use of accessory muscles of respiration.  CARDIOVASCULAR: S1, S2 normal. tachycardia  No murmurs, rubs, or gallops.  ABDOMEN: Soft, nontender, nondistended. Bowel sounds present. No organomegaly or mass.  EXTREMITIES: No pedal edema, cyanosis, or clubbing.  NEUROLOGIC: Cranial nerves II through XII are intact. Muscle strength 5/5 in all  extremities. Sensation intact. Gait not checked.  PSYCHIATRIC: The patient is alert and oriented x 3.  SKIN: No obvious rash, lesion, or ulcer.   LABORATORY PANEL:   CBC  Recent Labs Lab 09/06/15 1248  WBC 5.4  HGB 12.9*  HCT 38.1*  PLT 201   ------------------------------------------------------------------------------------------------------------------  Chemistries   Recent Labs Lab 09/06/15 1111  NA 140  K 4.1  CL 105  CO2 25  GLUCOSE 156*  BUN 7  CREATININE 0.77  CALCIUM 9.4  AST 32  ALT 13*  ALKPHOS 59  BILITOT 0.7   ------------------------------------------------------------------------------------------------------------------  Cardiac Enzymes  Recent Labs Lab 09/06/15 1111  TROPONINI <0.03   ------------------------------------------------------------------------------------------------------------------  RADIOLOGY:  Dg Chest 2 View  09/06/2015   CLINICAL DATA:  Asthma attack began Monday, worsening this morning. Difficulty breathing.  EXAM: CHEST  2 VIEW  COMPARISON:  None.  FINDINGS: There are low lung volumes. Patchy left lower lobe airspace opacity. Cannot exclude early infiltrate. Right lung is clear. Heart is normal size. No effusions or acute bony abnormality.  IMPRESSION: Low lung volumes with patchy left lower lobe opacity concerning for possible pneumonia.   Electronically Signed   By: Charlett Nose M.D.   On: 09/06/2015 11:59    EKG:   Orders placed or performed during the hospital encounter of 09/06/15  . EKG 12-Lead  . EKG 12-Lead  . ED EKG  . ED EKG    IMPRESSION AND PLAN:   Left lower lobe pneumonia (CAP) Sepsis (meet criteria of heart rates more than 120 and temperature more than 100.4 with confirmation of pneumonia) Hypoxia Tachycardia Asthma Accelerated Hypertension  The patient will be admitted to medical floor. I will  continueLevaquin, follow-up CBC, blood culture and sputum culture. I will start normal saline IV  fluid support. Start albuterol when necessary. Continue lisinopril and hydralazine IV when necessary.   All the records are reviewed and case discussed with ED provider. Management plans discussed with the patient, his sister and they are in agreement.  CODE STATUS: Full code  TOTAL TIME TAKING CARE OF THIS PATIENT: 52 minutes.    Shaune Pollack M.D on 09/06/2015 at 2:59 PM  Between 7am to 6pm - Pager - [phone]  After 6pm go to [email or web address] - password [email or web address] [facility]  [facility]  Office  [phone]  CC: Primary care physician; No PCP Per Patient

## 2015-09-06 NOTE — Progress Notes (Signed)
ANTIBIOTIC CONSULT NOTE - INITIAL  Pharmacy Consult for Levofloxain/Antibiotic Renal Dosing Indication: pneumonia  Allergies  Allergen Reactions  . Mushroom Extract Complex Anaphylaxis  . Penicillins Anaphylaxis    Patient Measurements: Height:  (175.3 cm) Weight: 177 lb (80.287 kg) IBW/kg (Calculated) : 70.7  Vital Signs: Temp: 101 F (38.3 C) (09/21 1659) Temp Source: Oral (09/21 1659) BP: 154/94 mmHg (09/21 1659) Pulse Rate: 122 (09/21 1659) Intake/Output from previous day:   Intake/Output from this shift:    Labs:  Recent Labs  09/06/15 1111 09/06/15 1248  WBC  --  5.4  HGB  --  12.9*  PLT  --  201  CREATININE 0.77  --    Estimated Creatinine Clearance: 136.2 mL/min (by C-G formula based on Cr of 0.77). No results for input(s): VANCOTROUGH, VANCOPEAK, VANCORANDOM, GENTTROUGH, GENTPEAK, GENTRANDOM, TOBRATROUGH, TOBRAPEAK, TOBRARND, AMIKACINPEAK, AMIKACINTROU, AMIKACIN in the last 72 hours.   Microbiology: Recent Results (from the past 720 hour(s))  Blood culture (routine x 2)     Status: None (Preliminary result)   Collection Time: 09/06/15 11:00 AM  Result Value Ref Range Status   Specimen Description BLOOD RIGHT ARM  Final   Special Requests BOTTLES DRAWN AEROBIC AND ANAEROBIC 8CC  Final   Culture NO GROWTH <12 HOURS  Final   Report Status PENDING  Incomplete  Blood culture (routine x 2)     Status: None (Preliminary result)   Collection Time: 09/06/15 11:11 AM  Result Value Ref Range Status   Specimen Description BLOOD LEFT ARM  Final   Special Requests BOTTLES DRAWN AEROBIC AND ANAEROBIC 1CC  Final   Culture NO GROWTH <12 HOURS  Final   Report Status PENDING  Incomplete    Medical History: Past Medical History  Diagnosis Date  . Asthma   . Hypertension     Medications:  Scheduled:  . enoxaparin (LOVENOX) injection  40 mg Subcutaneous Q24H  . [START ON 09/07/2015] Influenza vac split quadrivalent PF  0.5 mL Intramuscular Tomorrow-1000  .  [START ON 09/07/2015] levofloxacin (LEVAQUIN) IV  750 mg Intravenous Q24H  . lisinopril  20 mg Oral Daily  . pantoprazole  40 mg Oral Daily  . sodium chloride  3 mL Intravenous Q12H   Assessment: 29 yo male admitted for pneumonia.  Pharmacy consulted to renal adjust antibiotics.  Pt is currently on levaquin.  CrCl >100 ml/min; SCr 0.77  Goal of Therapy:  Resolution of infection  Plan:  Continue levofloxacin  IV Q24H for infection based on current renal function.  Will adjust if needed.  Pharmacy will continue to follow with you.  Thank you.  Jacqualyn Posey, PharmD, Clinical Pharmacist 09/06/2015,5:30 PM

## 2015-09-06 NOTE — ED Provider Notes (Addendum)
[facility] Emergency Department Provider Note     Time seen: ----------------------------------------- 11:11 AM on 09/06/2015 -----------------------------------------    I have reviewed the triage vital signs and the nursing notes.   HISTORY  Chief Complaint Shortness of Breath    HPI Leroy Alvarado is a 29 y.o. male presents to ER for shortness of breath. Patient reports she uses inhaler but it did not help. He notes that he was hospitalized for 11 days in the ICU in July for pneumonia. 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 Patient denies any other complaints this time.   Past Medical History  Diagnosis Date  . Asthma   . Hypertension     There are no active problems to display for this patient.   History reviewed. No pertinent past surgical history.  Allergies Mushroom extract complex and Penicillins  Social History Social History  Substance Use Topics  . Smoking status: Never Smoker   . Smokeless tobacco: Never Used  . Alcohol Use: No    Review of Systems Constitutional:positive for fever Eyes: Negative for visual changes. ENT: Negative for sore throat. Cardiovascular: Negative for chest pain. Respiratory: positive for shortness of breath Gastrointestinal: Negative for abdominal pain, vomiting and diarrhea. Genitourinary: Negative for dysuria. Musculoskeletal: Negative for back pain. Skin: Negative for rash. Neurological: Negative for headaches, focal weakness or numbness.  10-point ROS otherwise negative.  ____________________________________________   PHYSICAL EXAM:  VITAL SIGNS: ED Triage Vitals  Enc Vitals Group     BP 09/06/15 1056 162/119 mmHg     Pulse Rate 09/06/15 1056 133     Resp 09/06/15 1056 21     Temp  09/06/15 1056 100.7 F (38.2 C)     Temp Source 09/06/15 1056 Oral     SpO2 09/06/15 1056 100 %     Weight 09/06/15 1056 177 lb (80.287 kg)     Height 09/06/15 1056  (1.753 m)     Head Cir --      Peak Flow --      Pain Score --      Pain Loc --      Pain Edu? --      Excl. in GC? --     Constitutional: Alert and oriented. Well appearing and in no distress. Eyes: Conjunctivae are normal. PERRL. Normal extraocular movements. ENT   Head: Normocephalic and atraumatic.   Nose: No congestion/rhinnorhea.   Mouth/Throat: there are numerous white intraoral lesions consistent with thrush   Neck: No stridor. Cardiovascular: rapid rate, regular rhythm Normal and symmetric distal pulses are present in all extremities. No murmurs, rubs, or gallops. Respiratory: Normal respiratory effort with mild tachypnea. Breath sounds are clear and equal bilaterally. No wheezes/rales/rhonchi. Gastrointestinal: Soft and nontender. No distention. No abdominal bruits.  Musculoskeletal: Nontender with normal range of motion in all extremities. No joint effusions.  No lower extremity tenderness nor edema. Neurologic:  Normal speech and language. No gross focal neurologic deficits are appreciated. Speech is normal. No gait instability. Skin:  Skin is warm, dry and intact. No rash noted. Psychiatric: Mood and affect are normal. Speech and behavior are normal. Patient exhibits appropriate insight and judgment. ____________________________________________  EKG: Interpreted by me.sinus tachycardia with a rate of 129 bpm, normal PR interval, normal QS with,  normal QT interval. Possible LVH  ____________________________________________  ED COURSE:  Pertinent labs & imaging results that were available during my care of the patient were reviewed by me and considered in my medical decision making (see chart for details). Patient is in no acute distress but has signs of systemic inflammatory response.  We'll check labs cultures and lactate. ____________________________________________    LABS (pertinent positives/negatives)  Labs Reviewed  COMPREHENSIVE METABOLIC PANEL - Abnormal; Notable for the following:    Glucose, Bld 156 (*)    ALT 13 (*)    All other components within normal limits  BLOOD GAS, VENOUS - Abnormal; Notable for the following:    Bicarbonate 29.6 (*)    Acid-Base Excess 3.4 (*)    All other components within normal limits  CBC - Abnormal; Notable for the following:    RBC 4.29 (*)    Hemoglobin 12.9 (*)    HCT 38.1 (*)    RDW 16.2 (*)    All other components within normal limits  CULTURE, BLOOD (ROUTINE X 2)  CULTURE, BLOOD (ROUTINE X 2)  BRAIN NATRIURETIC PEPTIDE  TROPONIN I  LACTIC ACID, PLASMA  CBC WITH DIFFERENTIAL/PLATELET  LACTIC ACID, PLASMA    RADIOLOGY Images were viewed by me  Chest x-ray IMPRESSION: Low lung volumes with patchy left lower lobe opacity concerning for possible pneumonia. ____________________________________________  FINAL ASSESSMENT AND PLAN  Dyspnea, pneumonia, systemic inflammatory response syndrome   Plan: Patient with labs and imaging as dictated above. Patient does not want to be admitted to the hospital at this time. Thankfully his lactate is unremarkable. He does have signs of systemic inflammatory response. He received his first dose of IV Levaquin here. He is advised to follow-up with without fail in one day for reevaluation.   Emily Filbert, MD  The patient is ambulated and his heart rate jumps to 150 and his oxygen saturations dropped to 86%. It would be beneficial to observe him overnight in the hospital. I'm unclear as to what caused him to get so sick so quickly. Emily Filbert, MD 09/06/15 1323  Emily Filbert, MD 09/06/15 1325  Emily Filbert, MD 09/06/15 [phone]

## 2015-09-06 NOTE — ED Notes (Signed)
Pt to ed from work via ems with reports that pt was in a smoky environment when he suddenly became sob, pt reports that he used his inhaler but it did not help. Pt also reports he was hospitalized for 11 days in the ICU in July for pneumonia.

## 2015-09-06 NOTE — ED Notes (Signed)
Walking o2 sats were assessed- pt walked half way down hall and back with sats dropping to 92% RA, when pt was placed back in bed sats dropped to 87% RA then slowly rose to 95% RA. Pts HR increased from 120's to 150. MD made aware.

## 2015-09-06 NOTE — Progress Notes (Signed)
Paged Dr Renae Gloss and notified that pt had fever and had no order for tylenol; Dr ordered tylenol 650 mg q6 hrs PRN for fever/mild pain

## 2015-09-06 NOTE — ED Notes (Signed)
Called floor to give report.  Floor unable to take patient due to diastolic BP being greater than 100.  Hydralazine given as per MAR.

## 2015-09-07 ENCOUNTER — Inpatient Hospital Stay: Payer: PRIVATE HEALTH INSURANCE

## 2015-09-07 DIAGNOSIS — J189 Pneumonia, unspecified organism: Secondary | ICD-10-CM

## 2015-09-07 DIAGNOSIS — J45901 Unspecified asthma with (acute) exacerbation: Secondary | ICD-10-CM

## 2015-09-07 LAB — BASIC METABOLIC PANEL
ANION GAP: 10 (ref 5–15)
BUN: 6 mg/dL (ref 6–20)
CALCIUM: 8.6 mg/dL — AB (ref 8.9–10.3)
CO2: 24 mmol/L (ref 22–32)
CREATININE: 0.71 mg/dL (ref 0.61–1.24)
Chloride: 110 mmol/L (ref 101–111)
GFR calc Af Amer: >60 mL/min (ref >60)
GLUCOSE: 95 mg/dL (ref 65–99)
Potassium: 3.5 mmol/L (ref 3.5–5.1)
Sodium: 144 mmol/L (ref 135–145)

## 2015-09-07 LAB — HIV 1/2 AB DIFFERENTIATION
HIV 1 Ab: POSITIVE — AB
HIV 2 Ab: NEGATIVE

## 2015-09-07 LAB — HIV ANTIBODY (ROUTINE TESTING W REFLEX)

## 2015-09-07 MED ORDER — MOMETASONE FURO-FORMOTEROL FUM 200-5 MCG/ACT IN AERO
2.0000 | INHALATION_SPRAY | Freq: Two times a day (BID) | RESPIRATORY_TRACT | Status: DC
  Administered 2015-09-07 – 2015-09-08 (×2): 2 via RESPIRATORY_TRACT
  Filled 2015-09-07: qty 8.8

## 2015-09-07 MED ORDER — ASPIRIN-ACETAMINOPHEN-CAFFEINE 250-250-65 MG PO TABS
2.0000 | ORAL_TABLET | Freq: Once | ORAL | Status: AC
  Administered 2015-09-07: 2 via ORAL
  Filled 2015-09-07: qty 2

## 2015-09-07 MED ORDER — SULFAMETHOXAZOLE-TRIMETHOPRIM 400-80 MG/5ML IV SOLN
15.0000 mg/kg/d | Freq: Four times a day (QID) | INTRAVENOUS | Status: DC
  Administered 2015-09-08 (×2): 300.8 mg via INTRAVENOUS
  Filled 2015-09-07 (×7): qty 18.8

## 2015-09-07 MED ORDER — PREDNISONE 20 MG PO TABS
20.0000 mg | ORAL_TABLET | Freq: Every day | ORAL | Status: DC
Start: 2015-09-08 — End: 2015-09-08
  Administered 2015-09-08: 20 mg via ORAL
  Filled 2015-09-07: qty 1

## 2015-09-07 MED ORDER — ALBUTEROL SULFATE (2.5 MG/3ML) 0.083% IN NEBU: 2.5000 mg | INHALATION_SOLUTION | RESPIRATORY_TRACT | Status: DC | PRN

## 2015-09-07 NOTE — Progress Notes (Signed)
Pt c/o headache. Given tylenol previous to my shift with no relief. Requested excedrin migraine. Spoke with Dr.Patel, one time order.

## 2015-09-07 NOTE — Progress Notes (Signed)
[facility] - [place] at [facility]   PATIENT NAME: Leroy Alvarado    MR#:  [id number]  DATE OF BIRTH:  [birth year]  SUBJECTIVE:  CHIEF COMPLAINT:   Chief Complaint  Patient presents with  . Shortness of Breath    came with hypoxia and pneumonia, this is third episode in last 5 months.  He said in June he was admitted to a hospital in [place] for 3 weeks in ICU with bacterial pneumonia.  Feels much better today.  REVIEW OF SYSTEMS:  CONSTITUTIONAL: this morning had fever, fatigue or weakness.  EYES: No blurred or double vision.  EARS, NOSE, AND THROAT: No tinnitus or ear pain.  RESPIRATORY: No cough,had shortness of breath- now better, wheezing or hemoptysis.  CARDIOVASCULAR: No chest pain, orthopnea, edema.  GASTROINTESTINAL: No nausea, vomiting, diarrhea or abdominal pain.  GENITOURINARY: No dysuria, hematuria.  ENDOCRINE: No polyuria, nocturia,  HEMATOLOGY: No anemia, easy bruising or bleeding SKIN: No rash or lesion. MUSCULOSKELETAL: No joint pain or arthritis.   NEUROLOGIC: No tingling, numbness, weakness.  PSYCHIATRY: No anxiety or depression.   ROS  DRUG ALLERGIES:   Allergies  Allergen Reactions  . Mushroom Extract Complex Anaphylaxis  . Penicillins Anaphylaxis    VITALS:  Blood pressure 120/83, pulse 94, temperature 99.5 F (37.5 C), temperature source Oral, resp. rate 18, height  (1.753 m), weight 80.287 kg (177 lb), SpO2 100 %.  PHYSICAL EXAMINATION:  GENERAL:  29 y.o.-year-old patient lying in the bed with no acute distress.  EYES: Pupils equal, round, reactive to light and accommodation. No scleral icterus. Extraocular muscles intact.  HEENT: Head atraumatic, normocephalic. Oropharynx and nasopharynx clear.  NECK:  Supple, no jugular venous distention. No thyroid enlargement, no tenderness.  LUNGS: Normal breath sounds bilaterally, no wheezing, mild crepitation. No use of accessory muscles of respiration.  CARDIOVASCULAR:  S1, S2 normal. No murmurs, rubs, or gallops.  ABDOMEN: Soft, nontender, nondistended. Bowel sounds present. No organomegaly or mass.  EXTREMITIES: No pedal edema, cyanosis, or clubbing.  NEUROLOGIC: Cranial nerves II through XII are intact. Muscle strength 5/5 in all extremities. Sensation intact. Gait not checked.  PSYCHIATRIC: The patient is alert and oriented x 3.  SKIN: No obvious rash, lesion, or ulcer.   Physical Exam LABORATORY PANEL:   CBC  Recent Labs Lab 09/06/15 1248  WBC 5.4  HGB 12.9*  HCT 38.1*  PLT 201   ------------------------------------------------------------------------------------------------------------------  Chemistries   Recent Labs Lab 09/06/15 1111 09/07/15 0422  NA 140 144  K 4.1 3.5  CL 105 110  CO2 25 24  GLUCOSE 156* 95  BUN 7 6  CREATININE 0.77 0.71  CALCIUM 9.4 8.6*  AST 32  --   ALT 13*  --   ALKPHOS 59  --   BILITOT 0.7  --    ------------------------------------------------------------------------------------------------------------------  Cardiac Enzymes  Recent Labs Lab 09/06/15 1111  TROPONINI <0.03   ------------------------------------------------------------------------------------------------------------------  RADIOLOGY:  Dg Chest 2 View  09/06/2015   CLINICAL DATA:  Asthma attack began Monday, worsening this morning. Difficulty breathing.  EXAM: CHEST  2 VIEW  COMPARISON:  None.  FINDINGS: There are low lung volumes. Patchy left lower lobe airspace opacity. Cannot exclude early infiltrate. Right lung is clear. Heart is normal size. No effusions or acute bony abnormality.  IMPRESSION: Low lung volumes with patchy left lower lobe opacity concerning for possible pneumonia.   Electronically Signed   By: Charlett Nose M.D.   On: 09/06/2015 11:59   Ct Chest Wo Contrast  09/07/2015   CLINICAL DATA:  Cough.  Evaluate for pneumonia.  EXAM: CT CHEST WITHOUT CONTRAST  TECHNIQUE: Multidetector CT imaging of the chest was  performed following the standard protocol without IV contrast.  COMPARISON:  09/06/2015  FINDINGS: Mediastinum: The heart size is normal. No pericardial effusion identified. The trachea is patent and appears midline. Normal appearance of the esophagus. No mediastinal or hilar adenopathy.  Lungs/Pleura: No pleural effusions identified. There is platelike atelectasis within the lingula and both lower lobes. Bilateral upper lobe predominant areas of pneumonitis and atelectasis is identified with diffuse ground-glass attenuation.  Upper Abdomen: The visualized portions of the liver and spleen are unremarkable. The adrenal glands are unremarkable. Visualized portions of the kidneys are normal.  Musculoskeletal: No aggressive lytic or sclerotic bone lesion identified. No focal bone abnormality noted.  IMPRESSION: 1. Bilateral upper lobe predominant areas of pneumonitis are identified compatible with multifocal pneumonia. 2. Subsegmental areas of atelectasis are noted in the lower lobes and lingula.   Electronically Signed   By: Signa Kell M.D.   On: 09/07/2015 17:40    ASSESSMENT AND PLAN:   * Left lower lobe pneumonia (CAP)    Sepsis (meet criteria of heart rates more than 120 and temperature more    than 100.4 with confirmation of pneumonia)      On levaquin, Bl cx negative so far.        Feels better. As had repeated pneumonia- called pulmonary consult.  * Hypoxia- due to above- resolved.  * Asthma- reasonably mild as rarely need inhaler.  * Accelerated Hypertension   Lisinopril and hydralazine as needed.    All the records are reviewed and case discussed with Care Management/Social Workerr. Management plans discussed with the patient, family and they are in agreement.  CODE STATUS: full  TOTAL TIME TAKING CARE OF THIS PATIENT: 35 minutes.   POSSIBLE D/C IN 2-3 DAYS, DEPENDING ON CLINICAL CONDITION.   Altamese Dilling M.D on 09/07/2015   Between 7am to 6pm - Pager -  [phone]  After 6pm go to [email or web address] - password [email or web address] [facility]  [facility]  Office  [phone]  CC: Primary care physician; No PCP Per Patient  Note: This dictation was prepared with Dragon dictation along with smaller phrase technology. Any transcriptional errors that result from this process are unintentional.

## 2015-09-07 NOTE — Consult Note (Signed)
Leroy Alvarado      Date: 09/07/2015,   MRN# [id number] Leroy Alvarado [birth year] Code Status:     Code Status Orders        Start     Ordered   09/06/15 1719  Full code   Continuous     09/06/15 1718     Hosp day:@LENGTHOFSTAYDAYS @ Referring MD: @     PCP:      AdmissionWeight: 177 lb (80.287 kg)                 CurrentWeight: 177 lb (80.287 kg) Leroy Alvarado is a 29 y.o. old male seen in [place] for acute wheezing.     CHIEF COMPLAINT:  Acute sob,  wheezing    HISTORY OF PRESENT ILLNESS  29 yo AAM admitted for acute SOb and wheezing, that was progressive in last 2 days,   In June, patient supposedly was in [place] when he had an "asthma attack" and was admitted fro apporx 3 weks. Patient started having asthma symptoms since May of this year. 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 Patient was discharged from  The hospital on steroids, he had not been on inhaled meds  Patient has fever 102 this AM. Patient WOB has signifcantly improved over last 24 hrs, patient was given 1 neb therapy, one dose of steroids and given abx  Patient would like to go home as soon as possible      PAST MEDICAL HISTORY   Past Medical History  Diagnosis Date  . Asthma   . Hypertension      SURGICAL HISTORY   History reviewed. No pertinent past surgical history.   FAMILY HISTORY   Family History  Problem Relation Age of Onset  . Hypertension Father      SOCIAL HISTORY   Social History  Substance Use Topics  . Smoking status: Never Smoker   . Smokeless tobacco: Never Used  . Alcohol Use: No     MEDICATIONS    Home Medication:  No current outpatient prescriptions on file.  Current Medication:  Current facility-administered medications:  .  0.9 %  sodium  chloride infusion, 250 mL, Intravenous, PRN, Shaune Pollack, MD .  0.9 %  sodium chloride infusion, , Intravenous, Continuous, Shaune Pollack, MD, Last Rate: 75 mL/hr at 09/07/15 0745 .  acetaminophen (TYLENOL) tablet 650 mg, 650 mg, Oral, Q6H PRN, Alford Highland, MD, 650 mg at 09/07/15 0800 .  albuterol (PROVENTIL) (2.5 MG/3ML) 0.083% nebulizer solution 2.5 mg, 2.5 mg, Inhalation, Q4H PRN, Erin Fulling, MD .  enoxaparin (LOVENOX) injection 40 mg, 40 mg, Subcutaneous, Q24H, Shaune Pollack, MD, 40 mg at 09/06/15 1730 .  hydrALAZINE (APRESOLINE) injection 10 mg, 10 mg, Intravenous, Q6H PRN, Shaune Pollack, MD, 10 mg at 09/06/15 1552 .  Influenza vac split quadrivalent PF (FLUARIX) injection 0.5 mL, 0.5 mL, Intramuscular, Tomorrow-1000, Shaune Pollack, MD .  levofloxacin (LEVAQUIN) IVPB 750 mg, 750 mg, Intravenous, Q24H, Shaune Pollack, MD .  lisinopril (PRINIVIL,ZESTRIL) tablet 20 mg, 20 mg, Oral, Daily, Shaune Pollack, MD, 20 mg at 09/07/15 4098 .  mometasone-formoterol (DULERA) 200-5 MCG/ACT inhaler 2 puff, 2 puff, Inhalation, BID, Erin Fulling, MD .  pantoprazole (PROTONIX) EC tablet 40 mg, 40 mg, Oral, Daily, Shaune Pollack, MD, 40 mg at 09/07/15 0907 .  [START  ON 09/08/2015] predniSONE (DELTASONE) tablet 20 mg, 20 mg, Oral, Q breakfast, Erin Fulling, MD .  sodium chloride 0.9 % injection 3 mL, 3 mL, Intravenous, Q12H, Shaune Pollack, MD, 3 mL at 09/06/15 2050 .  sodium chloride 0.9 % injection 3 mL, 3 mL, Intravenous, PRN, Shaune Pollack, MD    ALLERGIES   Mushroom extract complex and Penicillins     REVIEW OF SYSTEMS   Review of Systems  Constitutional: Positive for fever, chills and malaise/fatigue. Negative for weight loss and diaphoresis.  Eyes: Negative for blurred vision.  Respiratory: Positive for cough, shortness of breath and wheezing. Negative for hemoptysis and sputum production.   Cardiovascular: Negative for chest pain and palpitations.  Gastrointestinal: Negative for heartburn, nausea and vomiting.  Musculoskeletal:  Negative for myalgias and back pain.  Skin: Negative for rash.  Neurological: Negative for dizziness, weakness and headaches.  Psychiatric/Behavioral: The patient is not nervous/anxious.      VS: BP 120/83 mmHg  Pulse 94  Temp(Src) 99.5 F (37.5 C) (Oral)  Resp 18  Ht  (1.753 m)  Wt 177 lb (80.287 kg)  BMI 26.13 kg/m2  SpO2 100%     PHYSICAL EXAM  Physical Exam  Constitutional: He is oriented to person, place, and time. He appears well-developed and well-nourished. No distress.  HENT:  Head: Normocephalic and atraumatic.  Mouth/Throat: No oropharyngeal exudate.  Eyes: EOM are normal. Pupils are equal, round, and reactive to light. No scleral icterus.  Neck: Normal range of motion. Neck supple.  Cardiovascular: Normal rate, regular rhythm and normal heart sounds.   No murmur heard. Pulmonary/Chest: No stridor. No respiratory distress. He has no wheezes.  Abdominal: Soft. Bowel sounds are normal. He exhibits no distension. There is no tenderness. There is no rebound.  Musculoskeletal: Normal range of motion. He exhibits no edema.  Neurological: He is alert and oriented to person, place, and time. He displays normal reflexes. Coordination normal.  Skin: Skin is warm. He is not diaphoretic.  Psychiatric: He has a normal mood and affect.        LABS    Recent Labs     09/06/15  1111  09/06/15  1248  09/07/15  0422  HGB   --   12.9*   --   HCT   --   38.1*   --   MCV   --   88.9   --   WBC   --   5.4   --   BUN  7   --   6  CREATININE  0.77   --   0.71  GLUCOSE  156*   --   95  CALCIUM  9.4   --   8.6*  ,    No results for input(s): PH in the last 72 hours.  Invalid input(s): PCO2, PO2, BASEEXCESS, BASEDEFICITE, TFT    CULTURE RESULTS   Recent Results (from the past 240 hour(s))  Blood culture (routine x 2)     Status: None (Preliminary result)   Collection Time: 09/06/15 11:00 AM  Result Value Ref Range Status   Specimen Description BLOOD RIGHT  ARM  Final   Special Requests BOTTLES DRAWN AEROBIC AND ANAEROBIC 8CC  Final   Culture NO GROWTH < 24 HOURS  Final   Report Status PENDING  Incomplete  Blood culture (routine x 2)     Status: None (Preliminary result)   Collection Time: 09/06/15 11:11 AM  Result Value Ref Range Status   Specimen Description  BLOOD LEFT ARM  Final   Special Requests BOTTLES DRAWN AEROBIC AND ANAEROBIC 1CC  Final   Culture NO GROWTH < 24 HOURS  Final   Report Status PENDING  Incomplete          IMAGING    Dg Chest 2 View  09/06/2015   CLINICAL DATA:  Asthma attack began Monday, worsening this morning. Difficulty breathing.  EXAM: CHEST  2 VIEW  COMPARISON:  None.  FINDINGS: There are low lung volumes. Patchy left lower lobe airspace opacity. Cannot exclude early infiltrate. Right lung is clear. Heart is normal size. No effusions or acute bony abnormality.  IMPRESSION: Low lung volumes with patchy left lower lobe opacity concerning for possible pneumonia.   Electronically Signed   By: Charlett Nose M.D.   On: 09/06/2015 11:59       ASSESSMENT/PLAN   29 yo AAM admitted for acute SOB and wheezing in what seems to be an acute asthma attack (reactive airways disease)  from environmental exposure with acute pneumonia  1.obtain CT chest to assess lung fields 2.start dulera 3.albuterol neb as needed 4.check UDS 5.check HIV 6.oxygen as needed    I have personally obtained a history, examined the patient, evaluated laboratory and independently reviewed imaging results, formulated the assessment and plan and placed orders.  The Patient requires high complexity decision making for assessment and support, frequent evaluation and titration of therapies, application of advanced monitoring technologies and extensive interpretation of multiple databases.  Time spent with patient 40 minutes.  Patient is satisfied with Plan of action and management.    Lucie Leather, M.D.  [facility]  Medical Director [facility] Medical Director [facility] Cardio-Pulmonary Department

## 2015-09-07 NOTE — Progress Notes (Addendum)
Patient has refused the HIV testing that Dr. Belia Heman has ordered, stating that he would prefer to have that done with his primary care. When asked who his primary care physician is he stated "[facility], but I haven't been in a while."  Dr. Belia Heman was notified of this via telephone.  Order for HIV testing was discontined.

## 2015-09-07 NOTE — Progress Notes (Addendum)
ANTIBIOTIC CONSULT NOTE - INITIAL  Pharmacy Consult for Septra dosing Indication: Pneumocystis pneumonia  Allergies  Allergen Reactions  . Mushroom Extract Complex Anaphylaxis  . Penicillins Anaphylaxis    Patient Measurements: Height:  (175.3 cm) Weight: 177 lb (80.287 kg) IBW/kg (Calculated) : 70.7 Adjusted Body Weight: n/a  Vital Signs: Temp: 100.7 F (38.2 C) (09/22 2052) Temp Source: Oral (09/22 2052) BP: 141/85 mmHg (09/22 2052) Pulse Rate: 107 (09/22 2052) Intake/Output from previous day: 09/21 0701 - 09/22 0700 In: 980 [I.V.:980] Out: 550 [Urine:550] Intake/Output from this shift: Total I/O In: 280 [I.V.:280] Out: 0   Labs:  Recent Labs  09/06/15 1111 09/06/15 1248 09/07/15 0422  WBC  --  5.4  --   HGB  --  12.9*  --   PLT  --  201  --   CREATININE 0.77  --  0.71   Estimated Creatinine Clearance: 136.2 mL/min (by C-G formula based on Cr of 0.71). No results for input(s): VANCOTROUGH, VANCOPEAK, VANCORANDOM, GENTTROUGH, GENTPEAK, GENTRANDOM, TOBRATROUGH, TOBRAPEAK, TOBRARND, AMIKACINPEAK, AMIKACINTROU, AMIKACIN in the last 72 hours.   Microbiology: Recent Results (from the past 720 hour(s))  Blood culture (routine x 2)     Status: None (Preliminary result)   Collection Time: 09/06/15 11:00 AM  Result Value Ref Range Status   Specimen Description BLOOD RIGHT ARM  Final   Special Requests BOTTLES DRAWN AEROBIC AND ANAEROBIC 8CC  Final   Culture NO GROWTH < 24 HOURS  Final   Report Status PENDING  Incomplete  Blood culture (routine x 2)     Status: None (Preliminary result)   Collection Time: 09/06/15 11:11 AM  Result Value Ref Range Status   Specimen Description BLOOD LEFT ARM  Final   Special Requests BOTTLES DRAWN AEROBIC AND ANAEROBIC 1CC  Final   Culture NO GROWTH < 24 HOURS  Final   Report Status PENDING  Incomplete    Medical History: Past Medical History  Diagnosis Date  . Asthma   . Hypertension     Medications:    Assessment: Blood cx pending CXR: multifocal pneumonia   Goal of Therapy:  Resolve infection  Plan:  Septra IV 15 mg/kg TMP component daily ordered with doses split q 6 hours.   McBane,Matthew S 09/07/2015,10:01 PM

## 2015-09-08 ENCOUNTER — Encounter: Payer: Self-pay | Admitting: Internal Medicine

## 2015-09-08 DIAGNOSIS — R0902 Hypoxemia: Secondary | ICD-10-CM

## 2015-09-08 DIAGNOSIS — A419 Sepsis, unspecified organism: Secondary | ICD-10-CM

## 2015-09-08 LAB — URINE DRUG SCREEN, QUALITATIVE (ARMC ONLY)
AMPHETAMINES, UR SCREEN: NOT DETECTED
BENZODIAZEPINE, UR SCRN: NOT DETECTED
Barbiturates, Ur Screen: NOT DETECTED
Cannabinoid 50 Ng, Ur ~~LOC~~: NOT DETECTED
Cocaine Metabolite,Ur ~~LOC~~: NOT DETECTED
MDMA (ECSTASY) UR SCREEN: NOT DETECTED
METHADONE SCREEN, URINE: NOT DETECTED
OPIATE, UR SCREEN: POSITIVE — AB
PHENCYCLIDINE (PCP) UR S: NOT DETECTED
Tricyclic, Ur Screen: NOT DETECTED

## 2015-09-08 LAB — LEGIONELLA PNEUMOPHILA SEROGP 1 UR AG: L. PNEUMOPHILA SEROGP 1 UR AG: NEGATIVE

## 2015-09-08 LAB — STREPTOCOCCUS PNEUMONIAE AG (CSF): STREPTOCOCCUS PNEUMONIAE AG: NEGATIVE

## 2015-09-08 LAB — HELPER T-LYMPH-CD4 (ARMC ONLY)
% CD 4 POS. LYMPH.: 2.2 % — AB (ref 30.8–58.5)
ABSOLUTE CD 4 HELPER: 9 /uL — AB (ref 359–1519)
BASOS ABS: 0 10*3/uL (ref 0.0–0.2)
Basos: 0 %
EOS (ABSOLUTE): 0 10*3/uL (ref 0.0–0.4)
Eos: 0 %
HEMATOCRIT: 36.4 % — AB (ref 37.5–51.0)
Hemoglobin: 12.3 g/dL — ABNORMAL LOW (ref 12.6–17.7)
IMMATURE GRANS (ABS): 0 10*3/uL (ref 0.0–0.1)
IMMATURE GRANULOCYTES: 0 %
LYMPHS: 14 %
Lymphocytes Absolute: 0.4 10*3/uL — ABNORMAL LOW (ref 0.7–3.1)
MCH: 29.4 pg (ref 26.6–33.0)
MCHC: 33.8 g/dL (ref 31.5–35.7)
MCV: 87 fL (ref 79–97)
MONOCYTES: 13 %
Monocytes Absolute: 0.4 10*3/uL (ref 0.1–0.9)
NEUTROS ABS: 2.1 10*3/uL (ref 1.4–7.0)
NEUTROS PCT: 73 %
Platelets: 218 10*3/uL (ref 150–379)
RBC: 4.18 x10E6/uL (ref 4.14–5.80)
RDW: 16.2 % — AB (ref 12.3–15.4)
WBC: 2.9 10*3/uL — ABNORMAL LOW (ref 3.4–10.8)

## 2015-09-08 MED ORDER — PREDNISONE 20 MG PO TABS: 40.0000 mg | ORAL_TABLET | Freq: Two times a day (BID) | ORAL | Status: DC

## 2015-09-08 MED ORDER — PREDNISONE 20 MG PO TABS: 20.0000 mg | ORAL_TABLET | Freq: Every day | ORAL | Status: DC

## 2015-09-08 MED ORDER — SULFAMETHOXAZOLE-TRIMETHOPRIM 800-160 MG PO TABS: 2.0000 | ORAL_TABLET | Freq: Three times a day (TID) | ORAL | Status: DC

## 2015-09-08 MED ORDER — PREDNISONE 20 MG PO TABS: 20.0000 mg | ORAL_TABLET | Freq: Every day | ORAL | Status: AC

## 2015-09-08 MED ORDER — PREDNISONE 20 MG PO TABS: 40.0000 mg | ORAL_TABLET | Freq: Every day | ORAL | Status: AC

## 2015-09-08 MED ORDER — SULFAMETHOXAZOLE-TRIMETHOPRIM 800-160 MG PO TABS: 2.0000 | ORAL_TABLET | Freq: Four times a day (QID) | ORAL | Status: DC

## 2015-09-08 MED ORDER — PREDNISONE 20 MG PO TABS
20.0000 mg | ORAL_TABLET | Freq: Once | ORAL | Status: AC
  Administered 2015-09-08: 20 mg via ORAL
  Filled 2015-09-08: qty 1

## 2015-09-08 MED ORDER — PREDNISONE 20 MG PO TABS: 40.0000 mg | ORAL_TABLET | Freq: Two times a day (BID) | ORAL | Status: AC

## 2015-09-08 MED ORDER — PREDNISONE 20 MG PO TABS: 40.0000 mg | ORAL_TABLET | Freq: Every day | ORAL | Status: DC

## 2015-09-08 MED ORDER — MOMETASONE FURO-FORMOTEROL FUM 200-5 MCG/ACT IN AERO: 2.0000 | INHALATION_SPRAY | Freq: Two times a day (BID) | RESPIRATORY_TRACT | Status: DC

## 2015-09-08 NOTE — Progress Notes (Signed)
[facility]         [place], [place].   09/08/2015  Patient: Leroy Alvarado   Date of Birth:  10/28/1986  Date of admission:  09/06/2015  Date of Discharge  09/08/2015    To Whom it May Concern:   [name]  may return to work on 09/12/15.  PHYSICAL ACTIVITY:  Full  If you have any questions or concerns, please don't hesitate to call.  Sincerely,   Altamese Dilling M.D Pager [phone] Office : [phone]   .

## 2015-09-08 NOTE — Consult Note (Signed)
[facility] Consultation      Date: 09/08/2015,   MRN# [id number] Leroy Alvarado 08-Nov-1986 Code Status:     Code Status Orders        Start     Ordered   09/06/15 1719  Full code   Continuous     09/06/15 1718     Hosp day:@LENGTHOFSTAYDAYS @ Referring MD: @     PCP:      AdmissionWeight: 177 lb (80.287 kg)                 CurrentWeight: 177 lb (80.287 kg) Leroy Alvarado is a 29 y.o. old male seen in consultation for acute wheezing.     CHIEF COMPLAINT:  Follow up Acute sob,  wheezing    HISTORY OF PRESENT ILLNESS  Patient febrile yesterday, feels better today Ct chest shows b/l GGO and infiltrates  Prelim HIV test is +(positive) Confirmatory  Test is pending I have started prednisone and bactrim last night  I have explained the situation with patient and he DOES NOT want anyone to know I have also explained the need for BRonch and at this time PATIENT REFUSES TO [facility] He states that he had a bad experience in [facility] when they did the procedure  Patient ALSO REFUSES TO TALK WITH ID CONSULT     PAST MEDICAL HISTORY   Past Medical History  Diagnosis Date  . Asthma   . Hypertension      SURGICAL HISTORY   History reviewed. No pertinent past surgical history.   FAMILY HISTORY   Family History  Problem Relation Age of Onset  . Hypertension Father      SOCIAL HISTORY   Social History  Substance Use Topics  . Smoking status: Never Smoker   . Smokeless tobacco: Never Used  . Alcohol Use: No     MEDICATIONS    Home Medication:  No current outpatient prescriptions on file.  Current Medication:  Current facility-administered medications:  .  0.9 %  sodium chloride infusion, 250 mL, Intravenous, PRN, Shaune Pollack, MD .  0.9 %  sodium chloride infusion, , Intravenous, Continuous, Shaune Pollack, MD, Last Rate: 75 mL/hr at 09/08/15 0032 .  acetaminophen (TYLENOL) tablet 650 mg, 650 mg, Oral, Q6H PRN, Alford Highland, MD, 650 mg at 09/07/15 1827 .  albuterol (PROVENTIL) (2.5 MG/3ML) 0.083% nebulizer solution 2.5 mg, 2.5 mg, Inhalation, Q4H PRN, Erin Fulling, MD .  enoxaparin (LOVENOX) injection 40 mg, 40 mg, Subcutaneous, Q24H, Shaune Pollack, MD, 40 mg at 09/06/15 1730 .  hydrALAZINE (APRESOLINE) injection 10 mg, 10 mg, Intravenous, Q6H PRN, Shaune Pollack, MD, 10 mg at 09/06/15 1552 .  Influenza vac split quadrivalent PF (FLUARIX) injection 0.5 mL, 0.5 mL, Intramuscular, Tomorrow-1000, Shaune Pollack, MD .  levofloxacin (LEVAQUIN) IVPB 750 mg, 750 mg, Intravenous, Q24H, Shaune Pollack, MD, 750 mg at 09/07/15 1827 .  lisinopril (PRINIVIL,ZESTRIL) tablet 20 mg, 20 mg, Oral, Daily, Shaune Pollack, MD, 20 mg at 09/07/15 0907 .  mometasone-formoterol (DULERA) 200-5 MCG/ACT inhaler 2 puff, 2 puff, Inhalation, BID, Erin Fulling, MD, 2 puff at 09/08/15 0831 .  pantoprazole (PROTONIX) EC tablet 40 mg, 40 mg, Oral, Daily, Shaune Pollack, MD, 40 mg at 09/07/15 0907 .  predniSONE (DELTASONE) tablet 20 mg, 20 mg, Oral, Q breakfast, Erin Fulling, MD, 20 mg at 09/08/15 0831 .  sodium chloride 0.9 % injection 3 mL, 3 mL, Intravenous, Q12H, Shaune Pollack, MD, 3 mL at 09/06/15 2050 .  sodium chloride 0.9 %  injection 3 mL, 3 mL, Intravenous, PRN, Shaune Pollack, MD .  sulfamethoxazole-trimethoprim (BACTRIM) 300.8 mg in dextrose 5 % 250 mL IVPB, 15 mg/kg/day, Intravenous, 4 times per day, Erin Fulling, MD, 300.8 mg at 09/08/15 4098    ALLERGIES   Mushroom extract complex and Penicillins     REVIEW OF SYSTEMS   Review of Systems  Constitutional: Positive for fever and malaise/fatigue. Negative for chills, weight loss and diaphoresis.  Eyes: Negative for blurred vision.  Respiratory: Positive for shortness of breath. Negative for cough, hemoptysis, sputum production and wheezing.   Cardiovascular: Negative for chest pain and palpitations.  Gastrointestinal: Negative for heartburn, nausea and vomiting.  Musculoskeletal: Negative for myalgias and back  pain.  Skin: Negative for rash.  Neurological: Negative for dizziness, weakness and headaches.  Psychiatric/Behavioral: The patient is not nervous/anxious.      VS: BP 146/95 mmHg  Pulse 92  Temp(Src) 98.7 F (37.1 C) (Oral)  Resp 17  Ht  (1.753 m)  Wt 177 lb (80.287 kg)  BMI 26.13 kg/m2  SpO2 98%     PHYSICAL EXAM  Physical Exam  Constitutional: He is oriented to person, place, and time. He appears well-developed and well-nourished. No distress.  HENT:  Head: Normocephalic and atraumatic.  Mouth/Throat: No oropharyngeal exudate.  Eyes: EOM are normal. Pupils are equal, round, and reactive to light. No scleral icterus.  Neck: Normal range of motion. Neck supple.  Cardiovascular: Normal rate, regular rhythm and normal heart sounds.   No murmur heard. Pulmonary/Chest: No stridor. No respiratory distress. He has no wheezes.  Abdominal: Soft. Bowel sounds are normal. He exhibits no distension. There is no tenderness. There is no rebound.  Musculoskeletal: Normal range of motion. He exhibits no edema.  Neurological: He is alert and oriented to person, place, and time. He displays normal reflexes. Coordination normal.  Skin: Skin is warm. He is not diaphoretic.  Psychiatric: He has a normal mood and affect.        LABS    Recent Labs     09/06/15  1111  09/06/15  1248  09/07/15  0422  HGB   --   12.9*   --   HCT   --   38.1*   --   MCV   --   88.9   --   WBC   --   5.4   --   BUN  7   --   6  CREATININE  0.77   --   0.71  GLUCOSE  156*   --   95  CALCIUM  9.4   --   8.6*  ,    No results for input(s): PH in the last 72 hours.  Invalid input(s): PCO2, PO2, BASEEXCESS, BASEDEFICITE, TFT    CULTURE RESULTS   Recent Results (from the past 240 hour(s))  Blood culture (routine x 2)     Status: None (Preliminary result)   Collection Time: 09/06/15 11:00 AM  Result Value Ref Range Status   Specimen Description BLOOD RIGHT ARM  Final   Special Requests  BOTTLES DRAWN AEROBIC AND ANAEROBIC 8CC  Final   Culture NO GROWTH < 24 HOURS  Final   Report Status PENDING  Incomplete  Blood culture (routine x 2)     Status: None (Preliminary result)   Collection Time: 09/06/15 11:11 AM  Result Value Ref Range Status   Specimen Description BLOOD LEFT ARM  Final   Special Requests BOTTLES DRAWN AEROBIC AND ANAEROBIC 1CC  Final   Culture NO GROWTH < 24 HOURS  Final   Report Status PENDING  Incomplete          IMAGING    Dg Chest 2 View  09/06/2015   CLINICAL DATA:  Asthma attack began Monday, worsening this morning. Difficulty breathing.  EXAM: CHEST  2 VIEW  COMPARISON:  None.  FINDINGS: There are low lung volumes. Patchy left lower lobe airspace opacity. Cannot exclude early infiltrate. Right lung is clear. Heart is normal size. No effusions or acute bony abnormality.  IMPRESSION: Low lung volumes with patchy left lower lobe opacity concerning for possible pneumonia.   Electronically Signed   By: Charlett Nose M.D.   On: 09/06/2015 11:59   Ct Chest Wo Contrast  09/07/2015   CLINICAL DATA:  Cough.  Evaluate for pneumonia.  EXAM: CT CHEST WITHOUT CONTRAST  TECHNIQUE: Multidetector CT imaging of the chest was performed following the standard protocol without IV contrast.  COMPARISON:  09/06/2015  FINDINGS: Mediastinum: The heart size is normal. No pericardial effusion identified. The trachea is patent and appears midline. Normal appearance of the esophagus. No mediastinal or hilar adenopathy.  Lungs/Pleura: No pleural effusions identified. There is platelike atelectasis within the lingula and both lower lobes. Bilateral upper lobe predominant areas of pneumonitis and atelectasis is identified with diffuse ground-glass attenuation.  Upper Abdomen: The visualized portions of the liver and spleen are unremarkable. The adrenal glands are unremarkable. Visualized portions of the kidneys are normal.  Musculoskeletal: No aggressive lytic or sclerotic bone lesion  identified. No focal bone abnormality noted.  IMPRESSION: 1. Bilateral upper lobe predominant areas of pneumonitis are identified compatible with multifocal pneumonia. 2. Subsegmental areas of atelectasis are noted in the lower lobes and lingula.   Electronically Signed   By: Signa Kell M.D.   On: 09/07/2015 17:40       ASSESSMENT/PLAN   29 yo AAM admitted for acute SOB and wheezing in what seems to be an acute asthma attack (reactive airways disease)  from environmental exposure with acute pneumonia. After further assessment patient likely has acute HIV infection with PCP infectious pneumonia however patient REFUSES further diagnostic testing at this time. Patient also REFUSES to talk with ID at this time.   Will prescribe prednisone and Bactrim for 3 weeks:pharmacy to assist with dosing  I have no other recommendations at this time. I have encouraged patient to tell a family member about his situation and also advsied him to seek ID consultation.    I have personally obtained a history, examined the patient, evaluated laboratory and independently reviewed imaging results, formulated the assessment and plan and placed orders.  The Patient requires high complexity decision making for assessment and support, frequent evaluation and titration of therapies, application of advanced monitoring technologies and extensive interpretation of multiple databases.  Time spent with patient 45 minutes.  Patient is satisfied with Plan of action and management.    Lucie Leather, M.D.  [facility]  Medical Director [facility] Medical Director [facility] Cardio-Pulmonary Department

## 2015-09-08 NOTE — Discharge Summary (Addendum)
[facility] - [place] at [facility]   PATIENT NAME: Leroy Alvarado    MR#:  [id number]  DATE OF BIRTH:  1986-08-07  DATE OF ADMISSION:  09/06/2015 ADMITTING PHYSICIAN: Shaune Pollack, MD  DATE OF DISCHARGE:09/08/2015  PRIMARY CARE PHYSICIAN: No PCP Per Patient    ADMISSION DIAGNOSIS:  Thrush [B37.0] Community acquired pneumonia [J18.9] SIRS (systemic inflammatory response syndrome) [A41.9]  DISCHARGE DIAGNOSIS:  Principal Problem:   Pneumonia- Likely PCP- as found to have HIV positive. Active Problems:   Sepsis   Hypoxia   Asthma with exacerbation  SECONDARY DIAGNOSIS:   Past Medical History  Diagnosis Date  . Asthma   . Hypertension     HOSPITAL COURSE:  * Left lower lobe pneumonia (CAP)  Sepsis (meet criteria of heart rates more than 120 and temperature more than 100.4 with confirmation of pneumonia)  On levaquin, Bl cx negative so far.   HIV test reported positive, so Dr. Belia Heman suggested to treat for PCP.   He refused to get further test for HIV and to have consult with infectious disease.    But, later on my explaination- agreed to think about treatment options- and he will follow with Dr. Belia Heman in office- if he decide to start treatment for HIV.   Currently he does not want to inform his family about this, we will respect that.   I informed him in detail of outcome of both scenario- of progressive worsening of health status in absence of HIV treatment, Vs excellent outcomes of regular follow ups and adherence to treatment for HIV, He understands his options, and will decide later.    As he is feeling completely fine now- will discharge home  With Bactrim and prednisone treatment for 21 days.  * Hypoxia- due to above- resolved.  * Asthma- reasonably mild as rarely need inhaler.  * Accelerated Hypertension  Lisinopril and hydralazine as needed.   DISCHARGE CONDITIONS:   Stable.  CONSULTS OBTAINED:  Treatment Team:  Erin Fulling, MD  DRUG ALLERGIES:   Allergies  Allergen Reactions  . Mushroom Extract Complex Anaphylaxis  . Penicillins Anaphylaxis    DISCHARGE MEDICATIONS:   Current Discharge Medication List    START taking these medications   Details  mometasone-formoterol (DULERA) 200-5 MCG/ACT AERO Inhale 2 puffs into the lungs 2 (two) times daily. Qty: 1 Inhaler, Refills: 4    sulfamethoxazole-trimethoprim (BACTRIM DS,SEPTRA DS) 800-160 MG per tablet Take 2 tablets by mouth 3 (three) times daily with meals. Qty: 124 tablet, Refills: 0      CONTINUE these medications which have CHANGED   Details  !! predniSONE (DELTASONE) 20 MG tablet Take 2 tablets (40 mg total) by mouth 2 (two) times daily with a meal. Qty: 28 tablet, Refills: 0    !! predniSONE (DELTASONE) 20 MG tablet Take 2 tablets (40 mg total) by mouth daily with breakfast. Qty: 14 tablet, Refills: 0    !! predniSONE (DELTASONE) 20 MG tablet Take 1 tablet (20 mg total) by mouth daily with breakfast. Qty: 7 tablet, Refills: 0     !! - Potential duplicate medications found. Please discuss with provider.    CONTINUE these medications which have NOT CHANGED   Details  albuterol (PROVENTIL HFA;VENTOLIN HFA) 108 (90 BASE) MCG/ACT inhaler Inhale 1 puff into the lungs every 6 (six) hours as needed for shortness of breath.     lisinopril (PRINIVIL,ZESTRIL) 20 MG tablet Take 20 mg by mouth daily.      STOP taking these  medications     azithromycin (ZITHROMAX) 250 MG tablet      omeprazole (PRILOSEC) 20 MG capsule          DISCHARGE INSTRUCTIONS:    Follow with Dr. Belia Heman in office in 2 weeks.  If you experience worsening of your admission symptoms, develop shortness of breath, life threatening emergency, suicidal or homicidal thoughts you must seek medical attention immediately by calling 911 or calling your MD immediately  if symptoms less severe.  You Must read complete instructions/literature along with all the possible  adverse reactions/side effects for all the Medicines you take and that have been prescribed to you. Take any new Medicines after you have completely understood and accept all the possible adverse reactions/side effects.   Please note  You were cared for by a hospitalist during your hospital stay. If you have any questions about your discharge medications or the care you received while you were in the hospital after you are discharged, you can call the unit and asked to speak with the hospitalist on call if the hospitalist that took care of you is not available. Once you are discharged, your primary care physician will handle any further medical issues. Please note that NO REFILLS for any discharge medications will be authorized once you are discharged, as it is imperative that you return to your primary care physician (or establish a relationship with a primary care physician if you do not have one) for your aftercare needs so that they can reassess your need for medications and monitor your lab values.    Today   CHIEF COMPLAINT:   Chief Complaint  Patient presents with  . Shortness of Breath    HISTORY OF PRESENT ILLNESS:  Leroy Alvarado  is a 29 y.o. male with a known history of hypertension and recent diagnosis of asthma. He started to have shortness of breath and a cough today. He used the inhaler without improvement. He was found hypoxia with O2 saturation at 86 and tachycardia at 120 to 150's in the ED. He is treated with Levaquin in the ED. 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  VITAL SIGNS:  Blood pressure 146/95, pulse 92, temperature 98.7 F (37.1 C), temperature source Oral, resp. rate 17, height  (1.753 m), weight 80.287 kg (177 lb), SpO2 98 %.  I/O:   Intake/Output Summary (Last 24 hours) at 09/08/15 0945 Last data filed at 09/08/15 0817  Gross per  24 hour  Intake 2864.53 ml  Output    400 ml  Net 2464.53 ml    PHYSICAL EXAMINATION:   GENERAL: 29 y.o.-year-old patient lying in the bed with no acute distress.  EYES: Pupils equal, round, reactive to light and accommodation. No scleral icterus. Extraocular muscles intact.  HEENT: Head atraumatic, normocephalic. Oropharynx and nasopharynx clear.  NECK: Supple, no jugular venous distention. No thyroid enlargement, no tenderness.  LUNGS: Normal breath sounds bilaterally, no wheezing, mild crepitation. No use of accessory muscles of respiration.  CARDIOVASCULAR: S1, S2 normal. No murmurs, rubs, or gallops.  ABDOMEN: Soft, nontender, nondistended. Bowel sounds present. No organomegaly or mass.  EXTREMITIES: No pedal edema, cyanosis, or clubbing.  NEUROLOGIC: Cranial nerves II through XII are intact. Muscle strength 5/5 in all extremities. Sensation intact. Gait not checked.  PSYCHIATRIC: The patient is alert and oriented x 3.  SKIN: No obvious rash, lesion, or  ulcer.  DATA REVIEW:   CBC  Recent Labs Lab 09/06/15 1248  WBC 5.4  HGB 12.9*  HCT 38.1*  PLT 201    Chemistries   Recent Labs Lab 09/06/15 1111 09/07/15 0422  NA 140 144  K 4.1 3.5  CL 105 110  CO2 25 24  GLUCOSE 156* 95  BUN 7 6  CREATININE 0.77 0.71  CALCIUM 9.4 8.6*  AST 32  --   ALT 13*  --   ALKPHOS 59  --   BILITOT 0.7  --     Cardiac Enzymes  Recent Labs Lab 09/06/15 1111  TROPONINI <0.03    Microbiology Results  Results for orders placed or performed during the hospital encounter of 09/06/15  Blood culture (routine x 2)     Status: None (Preliminary result)   Collection Time: 09/06/15 11:00 AM  Result Value Ref Range Status   Specimen Description BLOOD RIGHT ARM  Final   Special Requests BOTTLES DRAWN AEROBIC AND ANAEROBIC 8CC  Final   Culture NO GROWTH < 24 HOURS  Final   Report Status PENDING  Incomplete  Blood culture (routine x 2)     Status: None (Preliminary result)    Collection Time: 09/06/15 11:11 AM  Result Value Ref Range Status   Specimen Description BLOOD LEFT ARM  Final   Special Requests BOTTLES DRAWN AEROBIC AND ANAEROBIC 1CC  Final   Culture NO GROWTH < 24 HOURS  Final   Report Status PENDING  Incomplete    RADIOLOGY:  Dg Chest 2 View  09/06/2015   CLINICAL DATA:  Asthma attack began Monday, worsening this morning. Difficulty breathing.  EXAM: CHEST  2 VIEW  COMPARISON:  None.  FINDINGS: There are low lung volumes. Patchy left lower lobe airspace opacity. Cannot exclude early infiltrate. Right lung is clear. Heart is normal size. No effusions or acute bony abnormality.  IMPRESSION: Low lung volumes with patchy left lower lobe opacity concerning for possible pneumonia.   Electronically Signed   By: Charlett Nose M.D.   On: 09/06/2015 11:59   Ct Chest Wo Contrast  09/07/2015   CLINICAL DATA:  Cough.  Evaluate for pneumonia.  EXAM: CT CHEST WITHOUT CONTRAST  TECHNIQUE: Multidetector CT imaging of the chest was performed following the standard protocol without IV contrast.  COMPARISON:  09/06/2015  FINDINGS: Mediastinum: The heart size is normal. No pericardial effusion identified. The trachea is patent and appears midline. Normal appearance of the esophagus. No mediastinal or hilar adenopathy.  Lungs/Pleura: No pleural effusions identified. There is platelike atelectasis within the lingula and both lower lobes. Bilateral upper lobe predominant areas of pneumonitis and atelectasis is identified with diffuse ground-glass attenuation.  Upper Abdomen: The visualized portions of the liver and spleen are unremarkable. The adrenal glands are unremarkable. Visualized portions of the kidneys are normal.  Musculoskeletal: No aggressive lytic or sclerotic bone lesion identified. No focal bone abnormality noted.  IMPRESSION: 1. Bilateral upper lobe predominant areas of pneumonitis are identified compatible with multifocal pneumonia. 2. Subsegmental areas of atelectasis  are noted in the lower lobes and lingula.   Electronically Signed   By: Signa Kell M.D.   On: 09/07/2015 17:40     Management plans discussed with the patient and he is in agreement.  CODE STATUS:     Code Status Orders        Start     Ordered   09/06/15 1719  Full code   Continuous     09/06/15 1718  TOTAL TIME TAKING CARE OF THIS PATIENT: 35 minutes.    Altamese Dilling M.D on 09/08/2015 at 9:45 AM  Between 7am to 6pm - Pager - [phone]  After 6pm go to [email or web address] - password [email or web address] [facility]  [place] [facility]  Office  [phone]  CC: Primary care physician; No PCP Per Patient   Note: This dictation was prepared with Dragon dictation along with smaller phrase technology. Any transcriptional errors that result from this process are unintentional.

## 2015-09-08 NOTE — Progress Notes (Signed)
Pt VSS; No complaints of pain nor nausea. Tolerating diet; Pt received discharge orders. Instructions were reviewed with pt with all questions answered. IV removed with dressing dry and intact. Prescriptions given to pt

## 2015-09-11 LAB — CULTURE, BLOOD (ROUTINE X 2)
Culture: NO GROWTH
Culture: NO GROWTH

## 2015-09-12 ENCOUNTER — Encounter: Payer: Self-pay | Admitting: Internal Medicine

## 2015-09-19 ENCOUNTER — Ambulatory Visit (INDEPENDENT_AMBULATORY_CARE_PROVIDER_SITE_OTHER): Payer: No Typology Code available for payment source | Admitting: Internal Medicine

## 2015-09-19 ENCOUNTER — Encounter: Payer: Self-pay | Admitting: Internal Medicine

## 2015-09-19 ENCOUNTER — Encounter: Payer: Self-pay | Admitting: *Deleted

## 2015-09-19 VITALS — BP 146/90 | HR 101 | Wt 180.0 lb

## 2015-09-19 DIAGNOSIS — B20 Human immunodeficiency virus [HIV] disease: Secondary | ICD-10-CM | POA: Diagnosis not present

## 2015-09-19 DIAGNOSIS — B370 Candidal stomatitis: Secondary | ICD-10-CM | POA: Insufficient documentation

## 2015-09-19 DIAGNOSIS — J189 Pneumonia, unspecified organism: Secondary | ICD-10-CM | POA: Diagnosis not present

## 2015-09-19 MED ORDER — FLUCONAZOLE 200 MG PO TABS: 200.0000 mg | ORAL_TABLET | Freq: Every day | ORAL | Status: DC

## 2015-09-19 MED ORDER — ALBUTEROL SULFATE (2.5 MG/3ML) 0.083% IN NEBU: 2.5000 mg | INHALATION_SOLUTION | RESPIRATORY_TRACT | Status: AC | PRN

## 2015-09-19 NOTE — Patient Instructions (Signed)
Thrush, Adult  Thrush, also called oral candidiasis, is a fungal infection that develops in the mouth and throat and on the tongue. It causes white patches to form on the mouth and tongue. Thrush is most common in older adults, but it can occur at any age.  Many cases of thrush are mild, but this infection can also be more serious. Thrush can be a recurring problem for people who have chronic illnesses or who take medicines that limit the body's ability to fight infection. Because these people have difficulty fighting infections, the fungus that causes thrush can spread throughout the body. This can cause life-threatening blood or organ infections. CAUSES  Thrush is usually caused by a yeast called Candida albicans. This fungus is normally present in small amounts in the mouth and on other mucous membranes. It usually causes no harm. However, when conditions are present that allow the fungus to grow uncontrolled, it invades surrounding tissues and becomes an infection. Less often, other Candida species can also lead to thrush.  RISK FACTORS Thrush is more likely to develop in the following people:  People with an impaired ability to fight infection (weakened immune system).   Older adults.   People with HIV.   People with diabetes.   People with dry mouth (xerostomia).   Pregnant women.   People with poor dental care, especially those who have false teeth.   People who use antibiotic medicines.  SIGNS AND SYMPTOMS  Thrush can be a mild infection that causes no symptoms. If symptoms develop, they may include:   A burning feeling in the mouth and throat. This can occur at the start of a thrush infection.   White patches that adhere to the mouth and tongue. The tissue around the patches may be red, raw, and painful. If rubbed (during tooth brushing, for example), the patches and the tissue of the mouth may bleed easily.   A bad taste in the mouth or difficulty tasting foods.    Cottony feeling in the mouth.   Pain during eating and swallowing. DIAGNOSIS  Your health care provider can usually diagnose thrush by looking in your mouth and asking you questions about your health.  TREATMENT  Medicines that help prevent the growth of fungi (antifungals) are the standard treatment for thrush. These medicines are either applied directly to the affected area (topical) or swallowed (oral). The treatment will depend on the severity of the condition.  Mild Thrush Mild cases of thrush may clear up with the use of an antifungal mouth rinse or lozenges. Treatment usually lasts about 14 days.  Moderate to Severe Thrush  More severe thrush infections that have spread to the esophagus are treated with an oral antifungal medicine. A topical antifungal medicine may also be used.   For some severe infections, a treatment period longer than 14 days may be needed.   Oral antifungal medicines are almost never used during pregnancy because the fetus may be harmed. However, if a pregnant woman has a rare, severe thrush infection that has spread to her blood, oral antifungal medicines may be used. In this case, the risk of harm to the mother and fetus from the severe thrush infection may be greater than the risk posed by the use of antifungal medicines.  Persistent or Recurrent Thrush For cases of thrush that do not go away or keep coming back, treatment may involve the following:   Treatment may be needed twice as long as the symptoms last.   Treatment will   include both oral and topical antifungal medicines.   People with weakened immune systems can take an antifungal medicine on a continuous basis to prevent thrush infections.  It is important to treat conditions that make you more likely to get thrush, such as diabetes or HIV.  HOME CARE INSTRUCTIONS   Only take over-the-counter or prescription medicine as directed by your health care provider. Talk to your health care  provider about an over-the-counter medicine called gentian violet, which kills bacteria and fungi.   Eat plain, unflavored yogurt as directed by your health care provider. Check the label to make sure the yogurt contains live cultures. This yogurt can help healthy bacteria grow in the mouth that can stop the growth of the fungus that causes thrush.   Try these measures to help reduce the discomfort of thrush:   Drink cold liquids such as water or iced tea.   Try flavored ice treats or frozen juices.   Eat foods that are easy to swallow, such as gelatin, ice cream, or custard.   If the patches in your mouth are painful, try drinking from a straw.   Rinse your mouth several times a day with a warm saltwater rinse. You can make the saltwater mixture with 1 tsp (6 g) of salt in 8 fl oz (0.2 L) of warm water.   If you wear dentures, remove the dentures before going to bed, brush them vigorously, and soak them in a cleaning solution as directed by your health care provider.   Women who are breastfeeding should clean their nipples with an antifungal medicine as directed by their health care provider. Dry the nipples after breastfeeding. Applying lanolin-containing body lotion may help relieve nipple soreness.  SEEK MEDICAL CARE IF:  Your symptoms are getting worse or are not improving within 7 days of starting treatment.   You have symptoms of spreading infection, such as white patches on the skin outside of the mouth.   You are nursing and you have redness, burning, or pain in the nipples that is not relieved with treatment.  MAKE SURE YOU:  Understand these instructions.  Will watch your condition.  Will get help right away if you are not doing well or get worse. Document Released: 08/27/2004 Document Revised: 09/22/2013 Document Reviewed: 07/05/2013 [employer] Patient Information 2015 [employer]. This information is not intended to replace advice given to you by your  health care provider. Make sure you discuss any questions you have with your health care provider.  

## 2015-09-19 NOTE — Addendum Note (Signed)
Addended by: Meyer Cory R on: 09/19/2015 09:04 AM   Modules accepted: Orders

## 2015-09-19 NOTE — Progress Notes (Signed)
[facility] [place] Pulmonary Medicine Consultation     Date: 09/19/2015,   MRN# [id number] Leroy Alvarado 1986-06-17 Code Status:  Hosp day:@LENGTHOFSTAYDAYS @ Referring MD: @     PCP:      AdmissionWeight: 180 lb (81.647 kg)                 CurrentWeight: 180 lb (81.647 kg) Leroy Alvarado is a 29 y.o. old male seen in consultation for pneumonia and HIV.   CC:  Patient here for Hospital follow up for pneumonia Patient feels so much better since discharge Patient with dx of HIV and presumed PJP pneumonia Was discharged on bactrim and prednisone Patient has intermittent coughing and wheezing, no cough Patient has sore throat, hoarse voice at times  p Patient    pa   MEDICATIONS    Home Medication:  Current Outpatient Rx  Name  Route  Sig  Dispense  Refill  . albuterol (PROVENTIL HFA;VENTOLIN HFA) 108 (90 BASE) MCG/ACT inhaler   Inhalation   Inhale 1 puff into the lungs every 6 (six) hours as needed for shortness of breath.          . lisinopril (PRINIVIL,ZESTRIL) 20 MG tablet   Oral   Take 20 mg by mouth daily.         . mometasone-formoterol (DULERA) 200-5 MCG/ACT AERO   Inhalation   Inhale 2 puffs into the lungs 2 (two) times daily.   1 Inhaler   4   . predniSONE (DELTASONE) 20 MG tablet   Oral   Take 2 tablets (40 mg total) by mouth daily with breakfast.   14 tablet   0   . predniSONE (DELTASONE) 20 MG tablet   Oral   Take 1 tablet (20 mg total) by mouth daily with breakfast.   7 tablet   0   . sulfamethoxazole-trimethoprim (BACTRIM DS,SEPTRA DS) 800-160 MG per tablet   Oral   Take 2 tablets by mouth 3 (three) times daily with meals.   124 tablet   0     For 21 days.   . fluconazole (DIFLUCAN) 200 MG tablet   Oral   Take 1 tablet (200 mg total) by mouth daily.   10 tablet   0     Current Medication:   Current outpatient prescriptions:  .  albuterol (PROVENTIL HFA;VENTOLIN HFA) 108 (90 BASE) MCG/ACT inhaler, Inhale 1 puff  into the lungs every 6 (six) hours as needed for shortness of breath. , Disp: , Rfl:  .  lisinopril (PRINIVIL,ZESTRIL) 20 MG tablet, Take 20 mg by mouth daily., Disp: , Rfl:  .  mometasone-formoterol (DULERA) 200-5 MCG/ACT AERO, Inhale 2 puffs into the lungs 2 (two) times daily., Disp: 1 Inhaler, Rfl: 4 .  predniSONE (DELTASONE) 20 MG tablet, Take 2 tablets (40 mg total) by mouth daily with breakfast., Disp: 14 tablet, Rfl: 0 .  [START ON 09/22/2015] predniSONE (DELTASONE) 20 MG tablet, Take 1 tablet (20 mg total) by mouth daily with breakfast., Disp: 7 tablet, Rfl: 0 .  sulfamethoxazole-trimethoprim (BACTRIM DS,SEPTRA DS) 800-160 MG per tablet, Take 2 tablets by mouth 3 (three) times daily with meals., Disp: 124 tablet, Rfl: 0 .  fluconazole (DIFLUCAN) 200 MG tablet, Take 1 tablet (200 mg total) by mouth daily., Disp: 10 tablet, Rfl: 0     ALLERGIES   Mushroom extract complex and Penicillins     REVIEW OF SYSTEMS   Review of Systems  Constitutional: Negative for fever, chills, weight loss, malaise/fatigue and diaphoresis.  HENT: Positive for sore throat.   Eyes: Negative for blurred vision and double vision.  Respiratory: Positive for cough and wheezing. Negative for hemoptysis, sputum production and shortness of breath.   Cardiovascular: Negative for chest pain, palpitations, orthopnea and leg swelling.  Gastrointestinal: Negative for heartburn, nausea, vomiting and abdominal pain.  Genitourinary: Negative for dysuria and urgency.  Musculoskeletal: Negative for myalgias and neck pain.  Skin: Negative for rash.  Neurological: Negative for dizziness, tingling, weakness and headaches.  Psychiatric/Behavioral: Negative for depression. The patient is not nervous/anxious.      VS: BP 146/90 mmHg  Pulse 101  Wt 180 lb (81.647 kg)  SpO2 99%     PHYSICAL EXAM   Physical Exam  Constitutional: He is oriented to person, place, and time. He appears well-developed and well-nourished.  No distress.  HENT:  Head: Normocephalic and atraumatic.  Mouth/Throat: Oropharyngeal exudate present.  C/w oral thrush  Eyes: EOM are normal. Pupils are equal, round, and reactive to light. No scleral icterus.  Neck: Normal range of motion. Neck supple.  Cardiovascular: Normal rate, regular rhythm and normal heart sounds.   No murmur heard. Pulmonary/Chest: No stridor. No respiratory distress. He has no wheezes.  Abdominal: Soft. Bowel sounds are normal. He exhibits no distension. There is no tenderness. There is no rebound.  Musculoskeletal: Normal range of motion. He exhibits no edema.  Neurological: He is alert and oriented to person, place, and time. He displays normal reflexes. Coordination normal.  Skin: Skin is warm. He is not diaphoretic.  Psychiatric: He has a normal mood and affect.        LABS    No results for input(s): HGB, HCT, MCV, WBC, POTASSIUM, CHLORIDE, BUN, CREATININE, GLUCOSE, CALCIUM, INR, PTT in the last 72 hours.  Invalid input(s): PLATELET, BANDS, NEUTROPHIL, LYMPHOCYTE, MONOCYTE, EOSINOPHILS, BASOPHIL, SODIUM, BICARBONATE, MAGNESIUM, PHOSPHORUS, PT, SGPT, SGOT,    No results for input(s): PH in the last 72 hours.  Invalid input(s): PCO2, PO2, BASEEXCESS, BASEDEFICITE, TFT    CULTURE RESULTS   No results found for this or any previous visit (from the past 240 hour(s)).        IMAGING    No results found.       ASSESSMENT/PLAN   29 yo AAM with nelwy diagnosis of HIV with presumed PJP pneumonia as patient had refused Bronchoscopy with oral thrush with intermittent wheezing and cough  1.continue Bactrim and Prednisone as prescribed 2.albuterol as needed 3.Fluconazole 200 mg daily for 10 days 4.Case discussed with Dr Sampson Goon with Infectious Disease  will see patient tomorrow 10/5 at 830 AM   I have personally obtained a history, examined the patient, evaluated laboratory and independently reviewed imaging results, formulated the  assessment and plan and placed orders.  The Patient requires high complexity decision making for assessment and support, frequent evaluation and titration of therapies, application of advanced monitoring technologies and extensive interpretation of multiple databases.   Patient  satisfied with Plan of action and management. All questions answered   Lucie Leather, M.D.  [facility]  Medical Director [facility] Medical Director [facility] Cardio-Pulmonary Department

## 2015-12-20 ENCOUNTER — Encounter (HOSPITAL_BASED_OUTPATIENT_CLINIC_OR_DEPARTMENT_OTHER): Payer: Self-pay | Admitting: Emergency Medicine

## 2015-12-20 ENCOUNTER — Emergency Department (HOSPITAL_BASED_OUTPATIENT_CLINIC_OR_DEPARTMENT_OTHER): Payer: Managed Care, Other (non HMO)

## 2015-12-20 ENCOUNTER — Emergency Department (HOSPITAL_BASED_OUTPATIENT_CLINIC_OR_DEPARTMENT_OTHER)
Admission: EM | Admit: 2015-12-20 | Discharge: 2015-12-20 | Discharge status: Against Medical Advice | Payer: Managed Care, Other (non HMO) | Attending: Emergency Medicine | Admitting: Emergency Medicine

## 2015-12-20 DIAGNOSIS — J189 Pneumonia, unspecified organism: Secondary | ICD-10-CM

## 2015-12-20 DIAGNOSIS — B370 Candidal stomatitis: Secondary | ICD-10-CM | POA: Diagnosis not present

## 2015-12-20 DIAGNOSIS — I10 Essential (primary) hypertension: Secondary | ICD-10-CM | POA: Insufficient documentation

## 2015-12-20 DIAGNOSIS — Z7951 Long term (current) use of inhaled steroids: Secondary | ICD-10-CM | POA: Diagnosis not present

## 2015-12-20 DIAGNOSIS — R509 Fever, unspecified: Secondary | ICD-10-CM | POA: Diagnosis not present

## 2015-12-20 DIAGNOSIS — B20 Human immunodeficiency virus [HIV] disease: Secondary | ICD-10-CM

## 2015-12-20 DIAGNOSIS — R000 Tachycardia, unspecified: Secondary | ICD-10-CM | POA: Insufficient documentation

## 2015-12-20 DIAGNOSIS — Z79899 Other long term (current) drug therapy: Secondary | ICD-10-CM | POA: Diagnosis not present

## 2015-12-20 DIAGNOSIS — J45909 Unspecified asthma, uncomplicated: Secondary | ICD-10-CM | POA: Insufficient documentation

## 2015-12-20 DIAGNOSIS — Z880 Allergy status to penicillin: Secondary | ICD-10-CM | POA: Insufficient documentation

## 2015-12-20 DIAGNOSIS — R630 Anorexia: Secondary | ICD-10-CM | POA: Insufficient documentation

## 2015-12-20 DIAGNOSIS — R05 Cough: Secondary | ICD-10-CM | POA: Diagnosis present

## 2015-12-20 DIAGNOSIS — Z21 Asymptomatic human immunodeficiency virus [HIV] infection status: Secondary | ICD-10-CM | POA: Insufficient documentation

## 2015-12-20 LAB — COMPREHENSIVE METABOLIC PANEL
ALK PHOS: 44 U/L (ref 38–126)
ALT: 21 U/L (ref 17–63)
ANION GAP: 9 (ref 5–15)
AST: 25 U/L (ref 15–41)
Albumin: 4 g/dL (ref 3.5–5.0)
BILIRUBIN TOTAL: 0.7 mg/dL (ref 0.3–1.2)
BUN: 9 mg/dL (ref 6–20)
CALCIUM: 8.8 mg/dL — AB (ref 8.9–10.3)
CO2: 22 mmol/L (ref 22–32)
Chloride: 107 mmol/L (ref 101–111)
Creatinine, Ser: 1 mg/dL (ref 0.61–1.24)
GFR calc non Af Amer: >60 mL/min (ref >60)
Glucose, Bld: 128 mg/dL — ABNORMAL HIGH (ref 65–99)
Potassium: 3.4 mmol/L — ABNORMAL LOW (ref 3.5–5.1)
SODIUM: 138 mmol/L (ref 135–145)
TOTAL PROTEIN: 6.9 g/dL (ref 6.5–8.1)

## 2015-12-20 LAB — CBC WITH DIFFERENTIAL/PLATELET
BASOS ABS: 0 10*3/uL (ref 0.0–0.1)
BASOS PCT: 0 %
Eosinophils Absolute: 0 10*3/uL (ref 0.0–0.7)
Eosinophils Relative: 0 %
HEMATOCRIT: 40.2 % (ref 39.0–52.0)
HEMOGLOBIN: 13.4 g/dL (ref 13.0–17.0)
Lymphocytes Relative: 9 %
Lymphs Abs: 0.8 10*3/uL (ref 0.7–4.0)
MCH: 29.8 pg (ref 26.0–34.0)
MCHC: 33.3 g/dL (ref 30.0–36.0)
MCV: 89.3 fL (ref 78.0–100.0)
Monocytes Absolute: 0.7 10*3/uL (ref 0.1–1.0)
Monocytes Relative: 8 %
NEUTROS ABS: 7.4 10*3/uL (ref 1.7–7.7)
NEUTROS PCT: 83 %
Platelets: 165 10*3/uL (ref 150–400)
RBC: 4.5 MIL/uL (ref 4.22–5.81)
RDW: 15 % (ref 11.5–15.5)
WBC: 8.9 10*3/uL (ref 4.0–10.5)

## 2015-12-20 LAB — I-STAT CG4 LACTIC ACID, ED: Lactic Acid, Venous: 1.06 mmol/L (ref 0.5–2.0)

## 2015-12-20 MED ORDER — MOXIFLOXACIN HCL 400 MG PO TABS: 400.0000 mg | ORAL_TABLET | Freq: Every day | ORAL | Status: DC

## 2015-12-20 MED ORDER — SULFAMETHOXAZOLE-TRIMETHOPRIM 800-160 MG PO TABS: 2.0000 | ORAL_TABLET | Freq: Three times a day (TID) | ORAL | Status: DC

## 2015-12-20 MED ORDER — LISINOPRIL 20 MG PO TABS: 20.0000 mg | ORAL_TABLET | Freq: Every day | ORAL | Status: DC

## 2015-12-20 MED ORDER — SODIUM CHLORIDE 0.9 % IV BOLUS (SEPSIS)
1000.0000 mL | Freq: Once | INTRAVENOUS | Status: AC
  Administered 2015-12-20: 1000 mL via INTRAVENOUS

## 2015-12-20 MED ORDER — ACETAMINOPHEN 325 MG PO TABS
650.0000 mg | ORAL_TABLET | Freq: Once | ORAL | Status: AC
  Administered 2015-12-20: 650 mg via ORAL
  Filled 2015-12-20: qty 2

## 2015-12-20 MED ORDER — FLUCONAZOLE 200 MG PO TABS: 200.0000 mg | ORAL_TABLET | Freq: Every day | ORAL | Status: DC

## 2015-12-20 NOTE — ED Notes (Signed)
Patient states that he has had a cough and cold x 2 weeks. The patient reports that he has taken mucinex and PCN. The patient was at Urgent care and nothing they gave has helped.

## 2015-12-20 NOTE — Discharge Instructions (Signed)
The ID clinic number is [phone]  Leroy Alvarado, [name], also called oral candidiasis, is a fungal infection that develops in the mouth and throat and on the tongue. It causes white patches to form on the mouth and tongue. Leroy Alvarado is most common in older adults, but it can occur at any age.  Many cases of thrush are mild, but this infection can also be more serious. Leroy Alvarado can be a recurring problem for people who have chronic illnesses or who take medicines that limit the body's ability to fight infection. Because these people have difficulty fighting infections, the fungus that causes thrush can spread throughout the body. This can cause life-threatening blood or organ infections. CAUSES  Leroy Alvarado is usually caused by a yeast called Candida albicans. This fungus is normally present in small amounts in the mouth and on other mucous membranes. It usually causes no harm. However, when conditions are present that allow the fungus to grow uncontrolled, it invades surrounding tissues and becomes an infection. Less often, other Candida species can also lead to thrush.  RISK FACTORS Leroy Alvarado is more likely to develop in the following people:  People with an impaired ability to fight infection (weakened immune system).   Older adults.   People with HIV.   People with diabetes.   People with dry mouth (xerostomia).   Pregnant women.   People with poor dental care, especially those who have false teeth.   People who use antibiotic medicines.  SIGNS AND SYMPTOMS  Leroy Alvarado can be a mild infection that causes no symptoms. If symptoms develop, they may include:   A burning feeling in the mouth and throat. This can occur at the start of a thrush infection.   White patches that adhere to the mouth and tongue. The tissue around the patches may be red, raw, and painful. If rubbed (during tooth brushing, for example), the patches and the tissue of the mouth may bleed easily.   A bad taste in the  mouth or difficulty tasting foods.   Cottony feeling in the mouth.   Pain during eating and swallowing. DIAGNOSIS  Your health care provider can usually diagnose thrush by looking in your mouth and asking you questions about your health.  TREATMENT  Medicines that help prevent the growth of fungi (antifungals) are the standard treatment for thrush. These medicines are either applied directly to the affected area (topical) or swallowed (oral). The treatment will depend on the severity of the condition.  Mild Thrush Mild cases of thrush may clear up with the use of an antifungal mouth rinse or lozenges. Treatment usually lasts about 14 days.  Moderate to Severe Thrush  More severe thrush infections that have spread to the esophagus are treated with an oral antifungal medicine. A topical antifungal medicine may also be used.   For some severe infections, a treatment period longer than 14 days may be needed.   Oral antifungal medicines are almost never used during pregnancy because the fetus may be harmed. However, if a pregnant woman has a rare, severe thrush infection that has spread to her blood, oral antifungal medicines may be used. In this case, the risk of harm to the mother and fetus from the severe thrush infection may be greater than the risk posed by the use of antifungal medicines.  Persistent or Recurrent Thrush For cases of thrush that do not go away or keep coming back, treatment may involve the following:   Treatment may be needed twice as long as the  symptoms last.   Treatment will include both oral and topical antifungal medicines.   People with weakened immune systems can take an antifungal medicine on a continuous basis to prevent thrush infections.  It is important to treat conditions that make you more likely to get thrush, such as diabetes or HIV.  HOME CARE INSTRUCTIONS   Only take over-the-counter or prescription medicine as directed by your health care  provider. Talk to your health care provider about an over-the-counter medicine called gentian violet, which kills bacteria and fungi.   Eat plain, unflavored yogurt as directed by your health care provider. Check the label to make sure the yogurt contains live cultures. This yogurt can help healthy bacteria grow in the mouth that can stop the growth of the fungus that causes thrush.   Try these measures to help reduce the discomfort of thrush:   Drink cold liquids such as water or iced tea.   Try flavored ice treats or frozen juices.   Eat foods that are easy to swallow, such as gelatin, ice cream, or custard.   If the patches in your mouth are painful, try drinking from a straw.   Rinse your mouth several times a day with a warm saltwater rinse. You can make the saltwater mixture with 1 tsp (6 g) of salt in 8 fl oz (0.2 L) of warm water.   If you wear dentures, remove the dentures before going to bed, brush them vigorously, and soak them in a cleaning solution as directed by your health care provider.   Women who are breastfeeding should clean their nipples with an antifungal medicine as directed by their health care provider. Dry the nipples after breastfeeding. Applying lanolin-containing body lotion may help relieve nipple soreness.  SEEK MEDICAL CARE IF:  Your symptoms are getting worse or are not improving within 7 days of starting treatment.   You have symptoms of spreading infection, such as white patches on the skin outside of the mouth.   You are nursing and you have redness, burning, or pain in the nipples that is not relieved with treatment.  MAKE SURE YOU:  Understand these instructions.  Will watch your condition.  Will get help right away if you are not doing well or get worse.   This information is not intended to replace advice given to you by your health care provider. Make sure you discuss any questions you have with your health care provider.     Document Released: 08/27/2004 Document Revised: 12/23/2014 Document Reviewed: 07/05/2013 Elsevier Interactive Patient Education 2016 [employer] Pneumocystis Jiroveci Pneumonia Pneumocystis jiroveci pneumonia is a fungal infection that affects the lungs. The infection occurs in premature infants and people who have a weakened immune system. A person may have a weakened immune system due to:   Certain cancers or cancer treatment.  HIV infection or AIDS.  Organ or bone marrow transplantation.  Treatment with corticosteroids or other medicines that cause immune suppression. CAUSES  This infection is caused by the fungal organism Pneumocystis jiroveci (once called Pneumocystis carinii). This organism is common in the environment. It is present in, or breathed into, the lungs of most people on a regular basis. It does not cause disease in people with healthy immune systems. However, when this fungus enters the lungs of a person with a weakened immune system, it can cause pneumocystis jiroveci pneumonia. SYMPTOMS  The most common symptoms are:   Fever.  Mild or dry cough.  Shortness of breath, especially with any  physical activity (exertion).  Rapid breathing. In people with this pneumonia and AIDS, symptoms may develop slowly over several weeks. Other people with this pneumonia and a weakened immune system usually get symptoms more suddenly.  DIAGNOSIS  To diagnose this infection, a health care provider must examine lung secretions under a microscope to see if the organism is present. The secretions are often obtained using a flexible tube (bronchoscope) that is inserted into the lungs by going through the mouth or nose. On occasion, the organism can be identified in coughed-up saliva mixed with mucus (sputum). Other tests may include:   A chest X-ray.  Blood tests.  Taking a tissue sample of the lungs (biopsy). TREATMENT  Treatment with antibiotic medicine (and sometimes steroid  medicines) is usually successful. This infection must be treated for several weeks. Because the infection is very serious, your health care provider may have you begin treatment before test results are available. If you have a weakened immune system and you are at high risk for this infection, your health care provider may prescribe an antibiotic to prevent the infection. This treatment can prevent both relapses and new episodes of the infection. HOME CARE INSTRUCTIONS   Only take over-the-counter or prescription medicines as directed by your health care provider.  Take your antibiotics as directed. Finish them even if you start to feel better.  Avoid smoking because it can contribute to pneumonia.  Schedule and keep all follow-up visits. SEEK IMMEDIATE MEDICAL CARE IF:   Any of your symptoms are getting worse rather than better.  You have a fever or persistent symptoms for more than 2-3 days.  You have a fever and your symptoms suddenly get worse.  You have nausea, vomiting, diarrhea, or a skin rash.   This information is not intended to replace advice given to you by your health care provider. Make sure you discuss any questions you have with your health care provider.   Document Released: 02/22/2003 Document Revised: 04/18/2015 Document Reviewed: 06/30/2012 Elsevier Interactive Patient Education [employer]

## 2015-12-20 NOTE — ED Notes (Signed)
Patient took an albuterol treatment about 1 hour ago.

## 2015-12-20 NOTE — ED Provider Notes (Signed)
CSN: [id number]     Arrival date & time 12/20/15  1511 History   First MD Initiated Contact with Patient 12/20/15 1621     Chief Complaint  Patient presents with  . Cough      Patient is a 30 y.o. male presenting with cough. The history is provided by the patient.  Cough Associated symptoms: fever   Associated symptoms: no chest pain    patient presents with cough. States he had for last few days. States around a month ago he was seen in urgent care and given amoxicillin. States he has not gotten better. Has had a cough with minimal production. He has history of asthma and has been using inhalers. Patient does have a relatively new diagnosis of HIV which she was not initially forthcoming with. Found in medical records. Had a CD4 count of 9. He initially didn't want treatment for the HIV reviewing the records but did follow-up with infectious disease but is not on anti-viral therapy because insurance would not pay for it. States that he has thrush from the amoxicillin.  Past Medical History  Diagnosis Date  . Asthma   . Hypertension   . HIV (human immunodeficiency virus infection) (HCC)    History reviewed. No pertinent past surgical history. Family History  Problem Relation Age of Onset  . Hypertension Father    Social History  Substance Use Topics  . Smoking status: Never Smoker   . Smokeless tobacco: Never Used  . Alcohol Use: No    Review of Systems  Constitutional: Positive for fever and appetite change.  Respiratory: Positive for cough.   Cardiovascular: Negative for chest pain.  Gastrointestinal: Negative for abdominal pain.  Musculoskeletal: Negative for back pain.  Skin: Negative for wound.      Allergies  Mushroom extract complex and Penicillins  Home Medications   Prior to Admission medications   Medication Sig Start Date End Date Taking? Authorizing Provider  albuterol (PROVENTIL HFA;VENTOLIN HFA) 108 (90 BASE) MCG/ACT inhaler Inhale 1 puff into the lungs  every 6 (six) hours as needed for shortness of breath.     Historical Provider, MD  albuterol (PROVENTIL) (2.5 MG/3ML) 0.083% nebulizer solution Take 3 mLs (2.5 mg total) by nebulization every 4 (four) hours as needed for wheezing or shortness of breath. 09/19/15   Erin Fulling, MD  fluconazole (DIFLUCAN) 200 MG tablet Take 1 tablet (200 mg total) by mouth daily. 12/20/15   Benjiman Core, MD  lisinopril (PRINIVIL,ZESTRIL) 20 MG tablet Take 1 tablet (20 mg total) by mouth daily. 12/20/15   Benjiman Core, MD  mometasone-formoterol [facility]) 200-5 MCG/ACT AERO Inhale 2 puffs into the lungs 2 (two) times daily. 09/08/15   Altamese Dilling, MD  moxifloxacin (AVELOX) 400 MG tablet Take 1 tablet (400 mg total) by mouth daily at 8 pm. 12/20/15   Benjiman Core, MD  sulfamethoxazole-trimethoprim (BACTRIM DS,SEPTRA DS) 800-160 MG tablet Take 2 tablets by mouth 3 (three) times daily with meals. 12/20/15   Benjiman Core, MD   BP 130/85 mmHg  Pulse 105  Temp(Src) 99.5 F (37.5 C) (Oral)  Resp 18  Ht 5\' 9"  (1.753 m)  Wt 200 lb (90.719 kg)  BMI 29.52 kg/m2  SpO2 95% Physical Exam  Constitutional: He appears well-developed and well-nourished.  HENT:  White plaques throughout tongue and posterior pharynx  Eyes: Pupils are equal, round, and reactive to light.  Neck: Neck supple.  Cardiovascular:  Regular tachycardia  Pulmonary/Chest:  Mild tachypnea. No wheezes. No respiratory distress.  Abdominal:  There is no tenderness.  Musculoskeletal: Normal range of motion.  Neurological: He is alert.  Skin: Skin is warm.    ED Course  Procedures (including critical care time) Labs Review Labs Reviewed  COMPREHENSIVE METABOLIC PANEL - Abnormal; Notable for the following:    Potassium 3.4 (*)    Glucose, Bld 128 (*)    Calcium 8.8 (*)    All other components within normal limits  CULTURE, BLOOD (ROUTINE X 2)  CULTURE, BLOOD (ROUTINE X 2)  CBC WITH DIFFERENTIAL/PLATELET  I-STAT CG4 LACTIC ACID, ED     Imaging Review Dg Chest 2 View  12/20/2015  CLINICAL DATA:  Cough and fever, HIV EXAM: CHEST - 2 VIEW COMPARISON:  09/06/2015 FINDINGS: Cardiac shadow is within normal limits. The lungs are well aerated bilaterally. Very minimal changes are noted in the bases projecting in the lower lobes on the lateral projection. These have improved in the interval from the prior exam and may represent some residual scarring. Correlation with the laboratory values and physical examination is recommended. IMPRESSION: Bibasilar changes which may represent scarring. Clinical correlation is recommended. Electronically Signed   By: Alcide CleverMark  Lukens M.D.   On: 12/20/2015 16:44   I have personally reviewed and evaluated these images and lab results as part of my medical decision-making.   EKG Interpretation None      MDM   Final diagnoses:  Recurrent pneumonia  HIV (human immunodeficiency virus infection) (HCC)  Thrush, oral    Patient with HIV disease likely AIDS. Has had noncompliance with treatment. Has apparently not had much desire to be treated either. Presents with fever and tachycardia. X-ray is improved but still could be PJP pneumonia. Discussed with Dr. Marcha DuttonSchneider from infectious disease. Will treat with antibiotics of Avelox and Bactrim. Patient is given information about follow-up at the clinic and maybe getting medicines for HIV disease. Patient left AMA and was not willing to stay for admission. He was aware of the potential poor outcome of this.    Benjiman CoreNathan Avey Mcmanamon, MD 12/21/15 [phone]

## 2015-12-20 NOTE — ED Notes (Signed)
RT assessed upon arrival to the room. Patient stated that he takes Dulera BID at home, but no rescue inhalers. BBS clear, slightly diminished in bases. No stress noted. RR 18, SAT 95%. RT to monitor as needed

## 2015-12-25 LAB — CULTURE, BLOOD (ROUTINE X 2)
Culture: NO GROWTH
Culture: NO GROWTH

## 2016-08-02 IMAGING — CT CT CHEST W/O CM
2 of 3 series · 17 of 46 positions shown, 19 images · non-contrast
Comparison: 09/06/2015

CLINICAL DATA: Cough.  Evaluate for pneumonia.

EXAM:
CT CHEST WITHOUT CONTRAST
TECHNIQUE: Multidetector CT imaging of the chest was performed following the
standard protocol without IV contrast.

[Series 2: routine chest wo · axial · 0.64mm/px · z∈[-601,-331]mm · 14 of 62 slices shown, 16 images]
[im 4/62  soft-tissue]
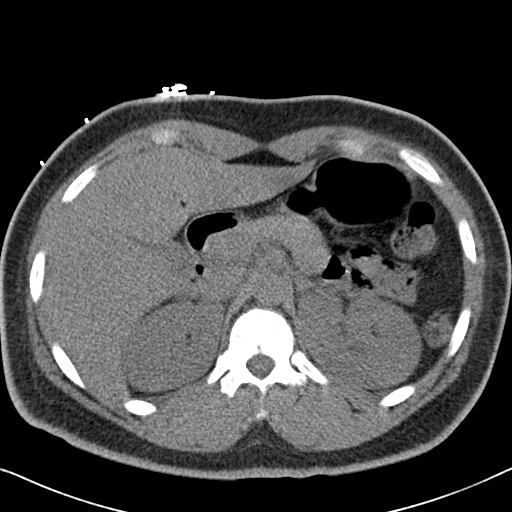
[im 4/62  bone]
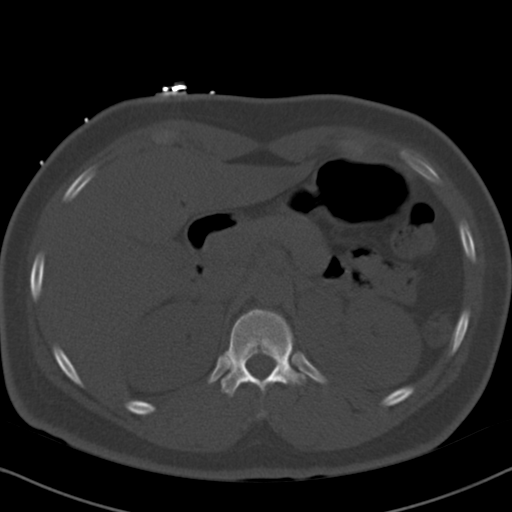
[im 8/62  soft-tissue]
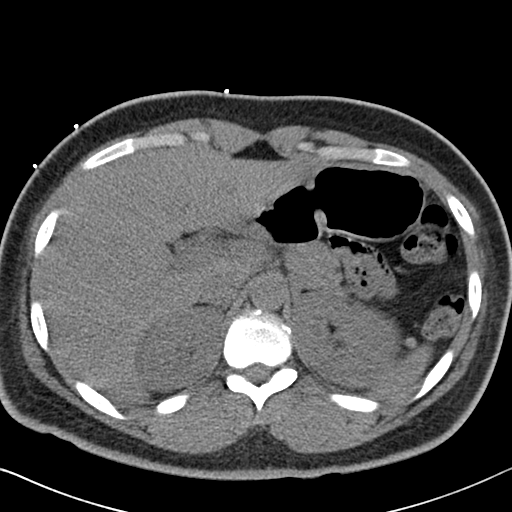
[im 12/62  soft-tissue]
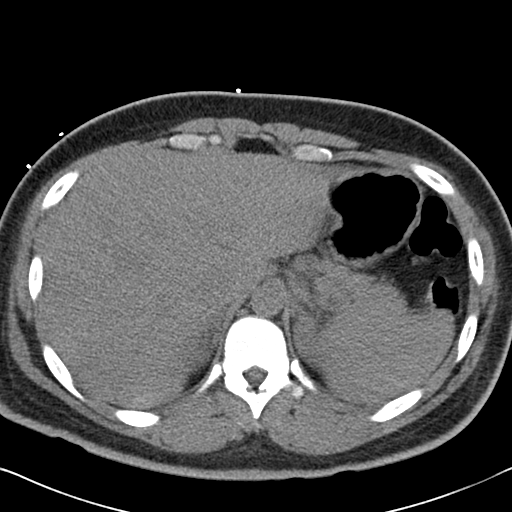
[im 16/62  soft-tissue]
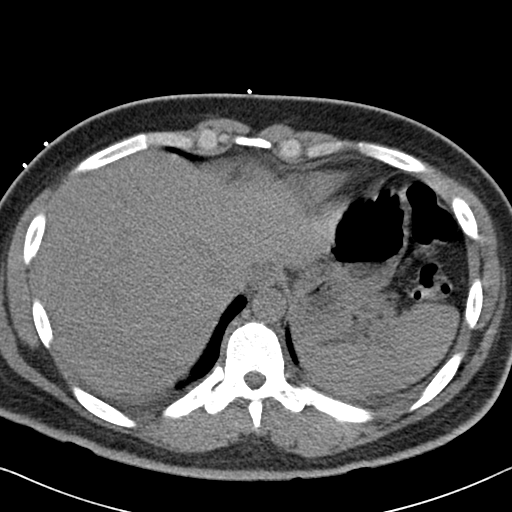
[im 20/62  soft-tissue]
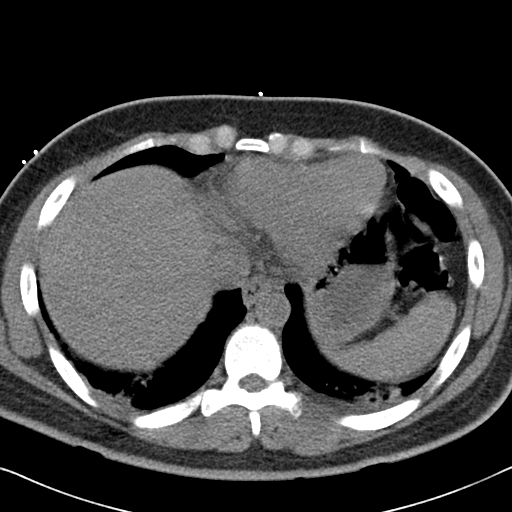
[im 24/62  soft-tissue]
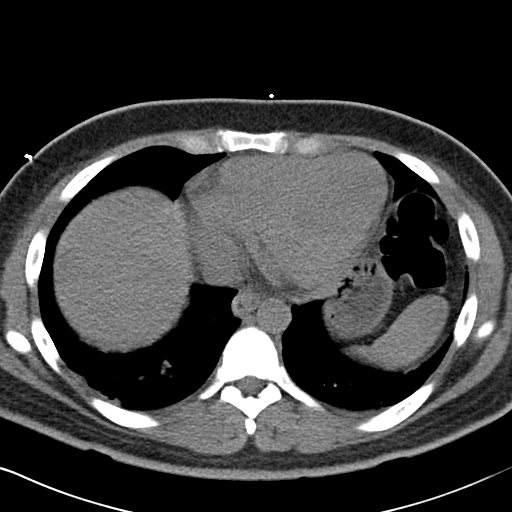
[im 28/62  soft-tissue]
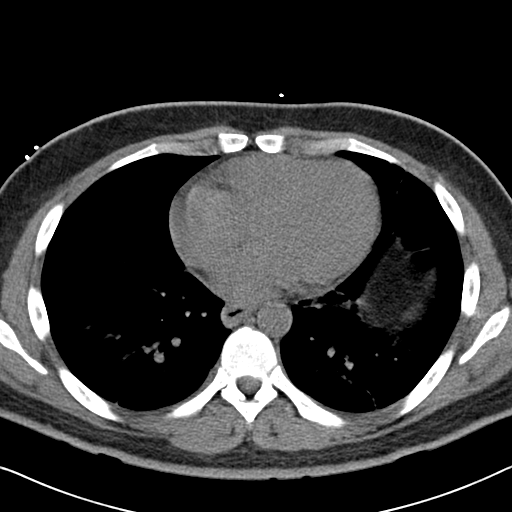
[im 34/62  soft-tissue]
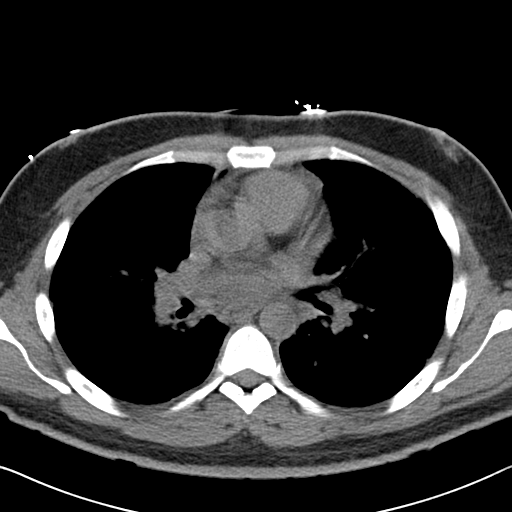
[im 38/62  soft-tissue]
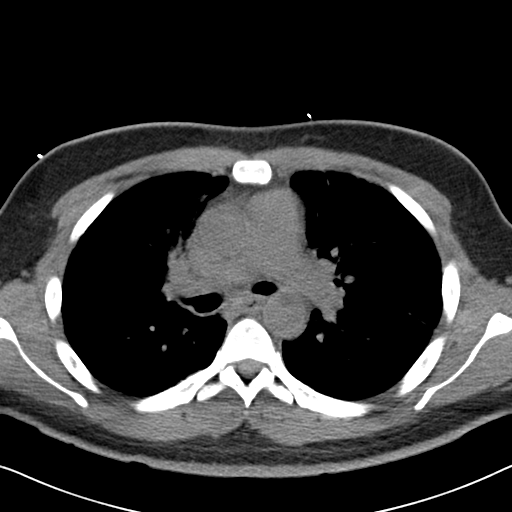
[im 38/62  bone]
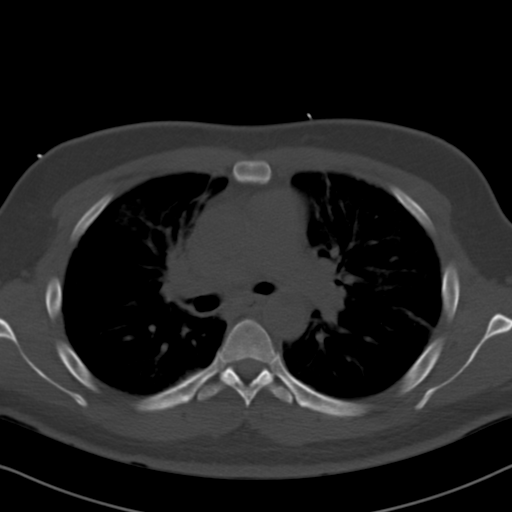
[im 42/62  soft-tissue]
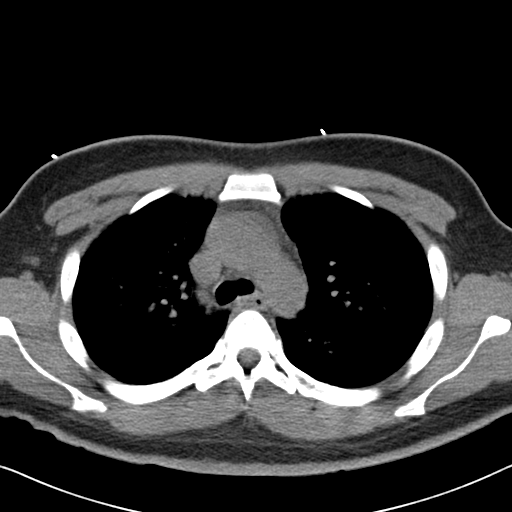
[im 46/62  soft-tissue]
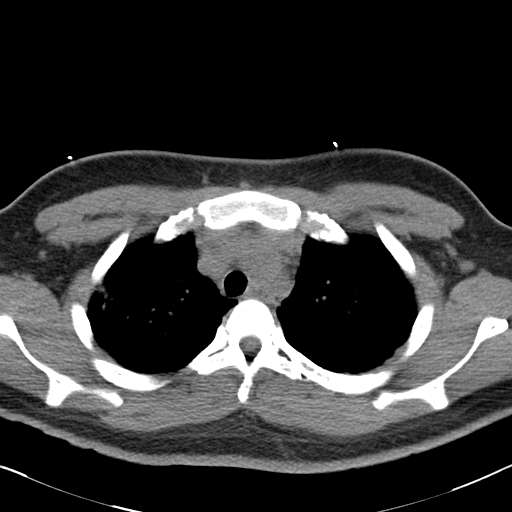
[im 50/62  soft-tissue]
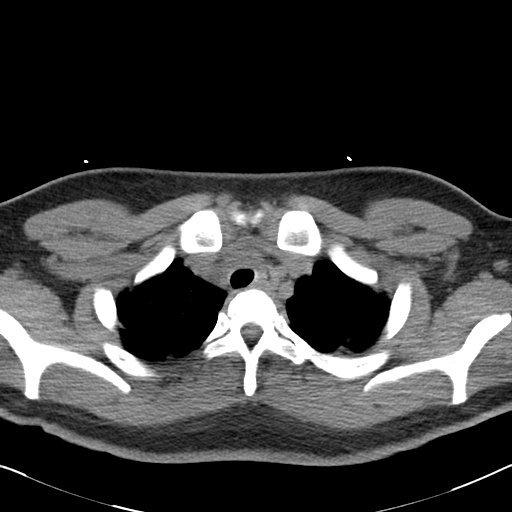
[im 54/62  soft-tissue]
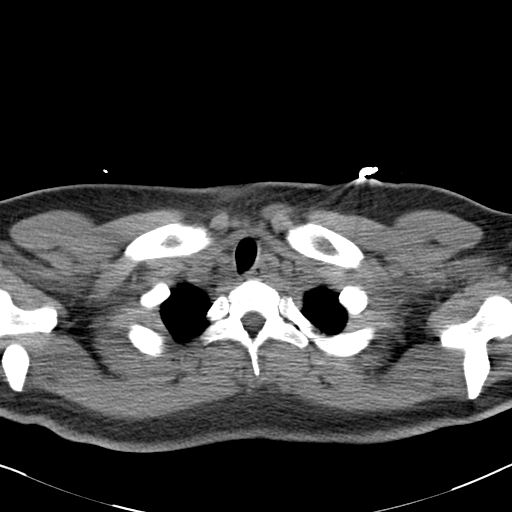
[im 58/62  soft-tissue]
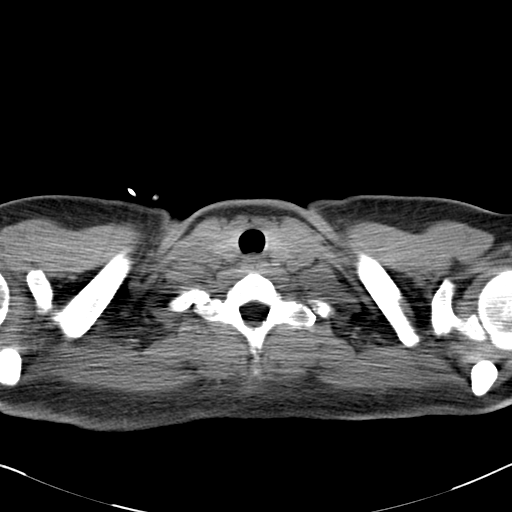

[Series 5: routine chest wo cor · coronal · 0.61mm/px · 3 of 128 slices shown]
[im 43/128  soft-tissue]
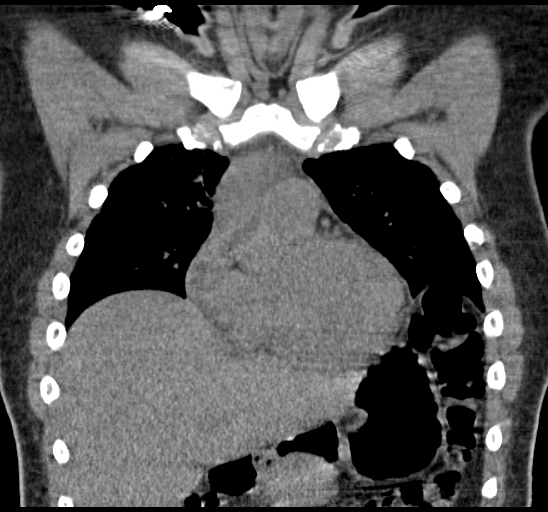
[im 57/128  soft-tissue]
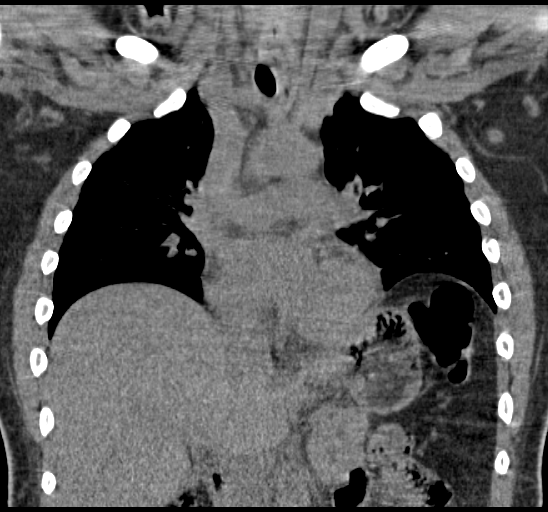
[im 71/128  soft-tissue]
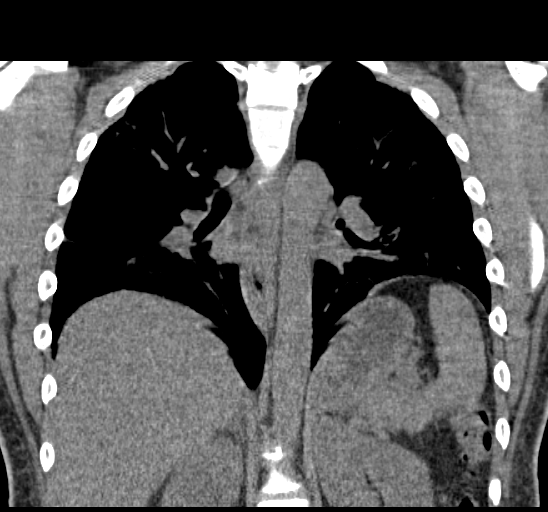

[17 of 46 positions shown; findings below may reference images not displayed]

FINDINGS: Mediastinum: The heart size is normal. No pericardial effusion
identified. The trachea is patent and appears midline. Normal
appearance of the esophagus. No mediastinal or hilar adenopathy.

Lungs/Pleura: No pleural effusions identified. There is platelike
atelectasis within the lingula and both lower lobes. Bilateral upper
lobe predominant areas of pneumonitis and atelectasis is identified
with diffuse ground-glass attenuation.

Upper Abdomen: The visualized portions of the liver and spleen are
unremarkable. The adrenal glands are unremarkable. Visualized
portions of the kidneys are normal.

Musculoskeletal: No aggressive lytic or sclerotic bone lesion
identified. No focal bone abnormality noted.
IMPRESSION: 1. Bilateral upper lobe predominant areas of pneumonitis are
identified compatible with multifocal pneumonia.
2. Subsegmental areas of atelectasis are noted in the lower lobes
and lingula.

## 2017-05-02 ENCOUNTER — Other Ambulatory Visit: Payer: Self-pay | Admitting: Internal Medicine

## 2017-05-02 ENCOUNTER — Ambulatory Visit
Admission: RE | Admit: 2017-05-02 | Discharge: 2017-05-02 | Disposition: A | Discharge status: Home / Self Care | Payer: Managed Care, Other (non HMO) | Source: Ambulatory Visit | Attending: Internal Medicine | Admitting: Internal Medicine

## 2017-05-02 DIAGNOSIS — J45991 Cough variant asthma: Secondary | ICD-10-CM

## 2018-05-06 DIAGNOSIS — R350 Frequency of micturition: Secondary | ICD-10-CM | POA: Diagnosis not present

## 2018-05-06 DIAGNOSIS — I10 Essential (primary) hypertension: Secondary | ICD-10-CM | POA: Diagnosis not present

## 2018-05-06 DIAGNOSIS — E119 Type 2 diabetes mellitus without complications: Secondary | ICD-10-CM | POA: Diagnosis not present

## 2018-05-06 DIAGNOSIS — R631 Polydipsia: Secondary | ICD-10-CM | POA: Diagnosis not present

## 2018-05-29 DIAGNOSIS — E119 Type 2 diabetes mellitus without complications: Secondary | ICD-10-CM | POA: Diagnosis not present

## 2018-05-29 DIAGNOSIS — I10 Essential (primary) hypertension: Secondary | ICD-10-CM | POA: Diagnosis not present

## 2018-12-21 DIAGNOSIS — E1165 Type 2 diabetes mellitus with hyperglycemia: Secondary | ICD-10-CM | POA: Diagnosis not present

## 2018-12-21 DIAGNOSIS — R358 Other polyuria: Secondary | ICD-10-CM | POA: Diagnosis not present

## 2018-12-21 DIAGNOSIS — I10 Essential (primary) hypertension: Secondary | ICD-10-CM | POA: Diagnosis not present

## 2018-12-31 ENCOUNTER — Ambulatory Visit: Payer: Managed Care, Other (non HMO) | Admitting: Infectious Diseases

## 2018-12-31 DIAGNOSIS — E119 Type 2 diabetes mellitus without complications: Secondary | ICD-10-CM | POA: Diagnosis not present

## 2018-12-31 DIAGNOSIS — I10 Essential (primary) hypertension: Secondary | ICD-10-CM | POA: Diagnosis not present

## 2018-12-31 DIAGNOSIS — R350 Frequency of micturition: Secondary | ICD-10-CM | POA: Diagnosis not present

## 2019-01-07 ENCOUNTER — Encounter

## 2019-01-14 DIAGNOSIS — I10 Essential (primary) hypertension: Secondary | ICD-10-CM | POA: Diagnosis not present

## 2019-01-14 DIAGNOSIS — E119 Type 2 diabetes mellitus without complications: Secondary | ICD-10-CM | POA: Diagnosis not present

## 2019-01-28 ENCOUNTER — Ambulatory Visit: Payer: 59 | Admitting: Infectious Diseases

## 2019-05-05 DIAGNOSIS — Z21 Asymptomatic human immunodeficiency virus [HIV] infection status: Secondary | ICD-10-CM | POA: Insufficient documentation

## 2019-05-13 ENCOUNTER — Other Ambulatory Visit: Payer: Self-pay

## 2019-05-13 ENCOUNTER — Emergency Department (HOSPITAL_BASED_OUTPATIENT_CLINIC_OR_DEPARTMENT_OTHER)
Admission: EM | Admit: 2019-05-13 | Discharge: 2019-05-13 | Disposition: A | Discharge status: Home / Self Care | Payer: 59 | Attending: Emergency Medicine | Admitting: Emergency Medicine

## 2019-05-13 ENCOUNTER — Encounter (HOSPITAL_BASED_OUTPATIENT_CLINIC_OR_DEPARTMENT_OTHER): Payer: Self-pay | Admitting: *Deleted

## 2019-05-13 DIAGNOSIS — R21 Rash and other nonspecific skin eruption: Secondary | ICD-10-CM | POA: Insufficient documentation

## 2019-05-13 DIAGNOSIS — I10 Essential (primary) hypertension: Secondary | ICD-10-CM | POA: Insufficient documentation

## 2019-05-13 DIAGNOSIS — Z21 Asymptomatic human immunodeficiency virus [HIV] infection status: Secondary | ICD-10-CM | POA: Diagnosis not present

## 2019-05-13 DIAGNOSIS — J45909 Unspecified asthma, uncomplicated: Secondary | ICD-10-CM | POA: Insufficient documentation

## 2019-05-13 DIAGNOSIS — Z79899 Other long term (current) drug therapy: Secondary | ICD-10-CM | POA: Diagnosis not present

## 2019-05-13 DIAGNOSIS — Z7984 Long term (current) use of oral hypoglycemic drugs: Secondary | ICD-10-CM | POA: Insufficient documentation

## 2019-05-13 LAB — CBC WITH DIFFERENTIAL/PLATELET
Abs Immature Granulocytes: 0.01 10*3/uL (ref 0.00–0.07)
Basophils Absolute: 0 10*3/uL (ref 0.0–0.1)
Basophils Relative: 1 %
Eosinophils Absolute: 0 10*3/uL (ref 0.0–0.5)
Eosinophils Relative: 1 %
HCT: 44.3 % (ref 39.0–52.0)
Hemoglobin: 14.5 g/dL (ref 13.0–17.0)
Immature Granulocytes: 0 %
Lymphocytes Relative: 31 %
Lymphs Abs: 1.4 10*3/uL (ref 0.7–4.0)
MCH: 29.2 pg (ref 26.0–34.0)
MCHC: 32.7 g/dL (ref 30.0–36.0)
MCV: 89.1 fL (ref 80.0–100.0)
Monocytes Absolute: 0.4 10*3/uL (ref 0.1–1.0)
Monocytes Relative: 10 %
Neutro Abs: 2.5 10*3/uL (ref 1.7–7.7)
Neutrophils Relative %: 57 %
Platelets: 400 10*3/uL (ref 150–400)
RBC: 4.97 MIL/uL (ref 4.22–5.81)
RDW: 12.1 % (ref 11.5–15.5)
WBC: 4.4 10*3/uL (ref 4.0–10.5)
nRBC: 0 % (ref 0.0–0.2)

## 2019-05-13 MED ORDER — HYDROXYZINE HCL 25 MG PO TABS: 25.0000 mg | ORAL_TABLET | Freq: Three times a day (TID) | ORAL | 0 refills | Status: DC | PRN

## 2019-05-13 MED ORDER — DEXAMETHASONE SODIUM PHOSPHATE 10 MG/ML IJ SOLN
10.0000 mg | Freq: Once | INTRAMUSCULAR | Status: AC
  Administered 2019-05-13: 19:00:00 10 mg via INTRAMUSCULAR
  Filled 2019-05-13: qty 1

## 2019-05-13 NOTE — ED Provider Notes (Signed)
[facility] EMERGENCY DEPARTMENT Provider Note   CSN: [id number] Arrival date & time: 05/13/19  1717    History   Chief Complaint Chief Complaint  Patient presents with  . Rash    HPI Leroy Alvarado is a 33 y.o. male.     Patient presents with mildly pruritic rash on his legs that started 3 days ago, and has now spread to his face. He does endorse working in the yard, found an ant hill under an object he lifted, but did not think any of the ants made contact with his skin. No new soaps, detergents, foods. No fevers or chills. No cough or shortness of breath. No abdominal pain, nausea, or vomiting. No myalgias. Works at [employer], is wearing a mask at work. No known sick contacts.   The history is provided by the patient. No language interpreter was used.  Rash  Location:  Leg, hand and face Facial rash location:  L cheek Hand rash location:  R hand and R wrist Leg rash location:  L lower leg and R lower leg Severity:  Mild Onset quality:  Gradual Duration:  3 days Progression:  Spreading Chronicity:  New Ineffective treatments:  Antihistamines Associated symptoms: no abdominal pain, no fatigue, no fever, no myalgias, no nausea, no shortness of breath, no sore throat, no throat swelling, no tongue swelling, not vomiting and not wheezing     Past Medical History:  Diagnosis Date  . Asthma   . HIV (human immunodeficiency virus infection) (HCC)   . Hypertension     Patient Active Problem List   Diagnosis Date Noted  . Thrush, oral 09/19/2015  . Asthma with exacerbation 09/07/2015  . Pneumonia 09/06/2015  . Sepsis (HCC) 09/06/2015  . Hypoxia 09/06/2015    History reviewed. No pertinent surgical history.      Home Medications    Prior to Admission medications   Medication Sig Start Date End Date Taking? Authorizing Provider  lisinopril (PRINIVIL,ZESTRIL) 20 MG tablet Take 1 tablet (20 mg total) by mouth daily. 12/20/15  Yes Benjiman Core, MD   metFORMIN (GLUCOPHAGE) 500 MG tablet Take by mouth 2 (two) times daily with a meal.   Yes [provider]  albuterol (PROVENTIL HFA;VENTOLIN HFA) 108 (90 BASE) MCG/ACT inhaler Inhale 1 puff into the lungs every 6 (six) hours as needed for shortness of breath.     [provider]  albuterol (PROVENTIL) (2.5 MG/3ML) 0.083% nebulizer solution Take 3 mLs (2.5 mg total) by nebulization every 4 (four) hours as needed for wheezing or shortness of breath. 09/19/15   Erin Fulling, MD  fluconazole (DIFLUCAN) 200 MG tablet Take 1 tablet (200 mg total) by mouth daily. 12/20/15   Benjiman Core, MD  JANUVIA 100 MG tablet Take 100 mg by mouth daily. 12/31/18   [provider]  mometasone-formoterol (DULERA) 200-5 MCG/ACT AERO Inhale 2 puffs into the lungs 2 (two) times daily. 09/08/15   Altamese Dilling, MD  moxifloxacin (AVELOX) 400 MG tablet Take 1 tablet (400 mg total) by mouth daily at 8 pm. 12/20/15   Benjiman Core, MD  sulfamethoxazole-trimethoprim (BACTRIM DS,SEPTRA DS) 800-160 MG tablet Take 2 tablets by mouth 3 (three) times daily with meals. 12/20/15   Benjiman Core, MD    Family History Family History  Problem Relation Age of Onset  . Hypertension Father     Social History Social History   Tobacco Use  . Smoking status: Never Smoker  . Smokeless tobacco: Never Used  Substance Use Topics  .  Alcohol use: No  . Drug use: No     Allergies   Mushroom extract complex and Penicillins   Review of Systems Review of Systems  Constitutional: Negative for fatigue and fever.  HENT: Negative for sore throat.   Respiratory: Negative for shortness of breath and wheezing.   Gastrointestinal: Negative for abdominal pain, nausea and vomiting.  Musculoskeletal: Negative for myalgias.  Skin: Positive for rash.  All other systems reviewed and are negative.    Physical Exam Updated Vital Signs BP (!) 148/103   Pulse (!) 106   Temp 99.4 F (37.4 C) (Oral)    Resp 20   Ht 5\' 9"  (1.753 m)   Wt 90.7 kg   SpO2 99%   BMI 29.53 kg/m   Physical Exam Vitals signs and nursing note reviewed.  Constitutional:      Appearance: Normal appearance. He is not ill-appearing.  Eyes:     Conjunctiva/sclera: Conjunctivae normal.  Cardiovascular:     Rate and Rhythm: Regular rhythm. Tachycardia present.  Pulmonary:     Effort: Pulmonary effort is normal.     Breath sounds: Normal breath sounds.  Abdominal:     General: Abdomen is flat.  Musculoskeletal: Normal range of motion.        General: No swelling.  Skin:    General: Skin is warm and dry.     Findings: Rash present. Rash is papular.     Comments: Multiple areas of solitary, flat to papular lesions that are mildly pruritic.  Neurological:     Mental Status: He is alert and oriented to person, place, and time.  Psychiatric:        Mood and Affect: Mood normal.      ED Treatments / Results  Labs (all labs ordered are listed, but only abnormal results are displayed) Labs Reviewed  CBC WITH DIFFERENTIAL/PLATELET    EKG None  Radiology No results found.  Procedures Procedures (including critical care time)  Medications Ordered in ED Medications  dexamethasone (DECADRON) injection 10 mg (10 mg Intramuscular Given 05/13/19 1849)     Initial Impression / Assessment and Plan / ED Course  I have reviewed the triage vital signs and the nursing notes.  Patient with slightly elevated temperature upon presentation in the ED. No other constitutional symptoms. Rash of undetermined origin. Will check CBC.  Pertinent labs & imaging results that were available during my care of the patient were reviewed by me and considered in my medical decision making (see chart for details).      Patient given decadron 10 mg IM in ED. Patient counseled that his blood sugar may run higher than normal for several days.  Patient with nonspecific eruption. No signs of infection. Discharge with symptomatic  treatment. Follow up with PCP in 2-3 days.          Final Clinical Impressions(s) / ED Diagnoses   Final diagnoses:  Rash and nonspecific skin eruption    ED Discharge Orders         Ordered    hydrOXYzine (ATARAX/VISTARIL) 25 MG tablet  Every 8 hours PRN     05/13/19 1837           Felicie MornSmith, Roselani Grajeda, NP 05/13/19 1851    Vanetta MuldersZackowski, Scott, MD 05/18/19 [phone]

## 2019-05-13 NOTE — ED Triage Notes (Addendum)
Rash on his feet, arms and legs x 3 days. It started after walking into an ant hill.

## 2019-05-13 NOTE — ED Notes (Signed)
ED Provider at bedside discussing dispo plan. °

## 2019-05-19 ENCOUNTER — Other Ambulatory Visit: Payer: Self-pay | Admitting: Gastroenterology

## 2019-05-19 DIAGNOSIS — R7989 Other specified abnormal findings of blood chemistry: Secondary | ICD-10-CM

## 2019-05-20 ENCOUNTER — Ambulatory Visit: Payer: 59

## 2019-05-24 ENCOUNTER — Ambulatory Visit: Payer: 59

## 2019-05-27 ENCOUNTER — Other Ambulatory Visit: Payer: Self-pay | Admitting: Infectious Diseases

## 2019-05-27 ENCOUNTER — Ambulatory Visit: Payer: 59 | Admitting: Infectious Diseases

## 2019-05-27 ENCOUNTER — Other Ambulatory Visit: Payer: Self-pay

## 2019-05-27 DIAGNOSIS — A539 Syphilis, unspecified: Secondary | ICD-10-CM

## 2019-05-27 MED ORDER — PENICILLIN G BENZATHINE 1200000 UNIT/2ML IM SUSP
2.4000 10*6.[IU] | Freq: Once | INTRAMUSCULAR | Status: DC
  Administered 2019-05-28: 09:00:00 2.4 10*6.[IU] via INTRAMUSCULAR

## 2019-05-27 NOTE — Progress Notes (Signed)
Pt had an appt with me today which was scheduled and then was cancelled by mistake and he came and could not wait. 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 He was given PO antibiotic- but he needs Benzathine penicillin 2.87million unit- called patient and told him to come and get the injection. He will be coming tomorrow morning for a visit and injection

## 2019-05-28 ENCOUNTER — Encounter: Payer: Self-pay | Admitting: Infectious Diseases

## 2019-05-28 ENCOUNTER — Ambulatory Visit: Payer: 59 | Attending: Infectious Diseases | Admitting: Infectious Diseases

## 2019-05-28 VITALS — BP 140/92 | HR 141 | Temp 97.8°F | Ht 69.0 in | Wt 195.0 lb

## 2019-05-28 DIAGNOSIS — Z91018 Allergy to other foods: Secondary | ICD-10-CM

## 2019-05-28 DIAGNOSIS — Z21 Asymptomatic human immunodeficiency virus [HIV] infection status: Secondary | ICD-10-CM

## 2019-05-28 DIAGNOSIS — A539 Syphilis, unspecified: Secondary | ICD-10-CM

## 2019-05-28 DIAGNOSIS — Z79899 Other long term (current) drug therapy: Secondary | ICD-10-CM

## 2019-05-28 DIAGNOSIS — J45909 Unspecified asthma, uncomplicated: Secondary | ICD-10-CM

## 2019-05-28 DIAGNOSIS — A5149 Other secondary syphilitic conditions: Secondary | ICD-10-CM | POA: Diagnosis not present

## 2019-05-28 DIAGNOSIS — Z7984 Long term (current) use of oral hypoglycemic drugs: Secondary | ICD-10-CM

## 2019-05-28 DIAGNOSIS — E118 Type 2 diabetes mellitus with unspecified complications: Secondary | ICD-10-CM | POA: Diagnosis not present

## 2019-05-28 DIAGNOSIS — I10 Essential (primary) hypertension: Secondary | ICD-10-CM

## 2019-05-28 DIAGNOSIS — Z8701 Personal history of pneumonia (recurrent): Secondary | ICD-10-CM

## 2019-05-28 DIAGNOSIS — R945 Abnormal results of liver function studies: Secondary | ICD-10-CM

## 2019-05-28 DIAGNOSIS — B20 Human immunodeficiency virus [HIV] disease: Secondary | ICD-10-CM

## 2019-05-28 NOTE — Progress Notes (Signed)
NAME: Leroy Alvarado  DOB: 1986-11-20  MRN: [id number]  Date/Time: 05/28/2019 8:10 AM  REQUESTING PROVIDER: Edwina Barth Subjective:  REASON FOR CONSULT:  To engage in HIV care ,  Recently diagnosed with secondary syphilis when he presented to [facility] clinic to his PCP office on 05/19/19 with disseminated rash and they contacted me and I recommended Syphils test and RPR came back at 1:128. He was given doxycycline by NP as patient said he could not come for penicillin injection. He was referred to [place] and we called him yesterday and made him come for Benzathine penicillin injection today  Leroy Alvarado is a 33 y.o.male  with a history of AIDs diagnosed 2016 when he was in [place] and had pneumonia- PCP- He saw Dr>Fitzgerald in the [place] [facility] 09/30/15 and was started on Genvoya Nadir Cd4 7 VL -[id number]  OI -PCP HAARt history- started on Genvoya and now on [place] [facility] thru [place] [facility] done on 11/2015 showed K 103N/ causing NNRTI resistance to viramune, efivarenz NRTI AbacavirZiagen Sensitive  RAMs*: None DidanosineVidexSensitive  RAMs*: None Emtricitabine EmtrivaSensitive  RAMs*: None LamivudineEpivir Sensitive  RAMs*: None Stavudine ZeritSensitive  RAMs*: None Tenofovir Viread Sensitive  RAMs*: None ZidovudineRetrovir Sensitive  RAMs*: None NNRTI Efavirenz Sustiva Resistant  RAMs*: A98G, K103N EtravirineIntelenceSensitive  RAMs*: A98G, V179I NevirapineViramuneResistant  RAMs*: A98G, K103N, V179I Rilpivirine EdurantSensitive  RAMs*: K103N INI DolutegravirTivicaySensitive  RAMs*: None ElvitegravirVitektaSensitive  RAMs*: None Raltegravir IsentressSensitive  RAMs*: None PI AtazanavirReyatazSensitive  RAMs*: Q58E, D60E, I62V Atazanavir/rReyataz /  rSensitive  RAMs*: Q58E, D60E, I62V Darunavir/r Prezista / r Sensitive  RAMs*: Q58E Fosamprenavir/r Lexiva / r Sensitive  RAMs*: Q58E Indinavir/r Crixivan / r Sensitive  RAMs*: None Lopinavir KaletraSensitive  RAMs*: None NelfinavirViracept Sensitive  RAMs*: Q58E Ritonavir Norvir Sensitive  RAMs*: None Saquinavir/rInvirase / r Sensitive  RAMs*: I62V Tipranavir/rAptivus / rSensitive  RAMs*: Q58E *RAMs = Resistance Associated Mutations observed Summary of Mutations Observed: RT: V35I, T39A, A98G, Q102K, K103N, K122Q, I135L, C162S, K173Q, D177E, V179I, R211K, E248D, A272S, R277K, A288S, V293I IN: D25E, S39C, M50I, V113I, T124A, T125A, D167E, V201I, K215N, V234L PR: K14V, L19R, P39S, R41K, Q58E, D60E, Q61E, I62V, L63P, K70K/R, I72I/L, V77I Assessment of drug susceptibility is based upon detected mutations and interpreted using an advanced proprietary algorithm (version 16).   HIV GenoSure PRIme(R) Interp - [facility] Comment  Comment: Interpretation algorithms for ritonavir-boosted protease inhibitors appropriate for the following dosages: AMP/r '600mg'$ /'100mg'$  BID; ATV/r '300mg'$ /'100mg'$  QD; IDV/r '800mg'$ /'200mg'$  BID; LPV/r '400mg'$ /'100mg'$  BID; SQV/r '1000mg'$ /'100mg'$  BID; TPV/r '500mg'$ /'200mg'$  BID; and DRV/r '600mg'$ /'100mg'$  BID.   He was started on genvoya- But he did not follow up for 2 years  And had stopped genvoya after taking it for 1 1/2 as he continued to be detectable and in OCT 2018 the VL was 277,000. He had another genotype which showed K70K/E but no resistance to NRTI /PI/Integrase inhibitor  OCT 2018 ? NRTI Abacavir    Ziagen    Sensitive   RAMs*: K70K/E Didanosine   Videx    Sensitive   RAMs*: K70K/E Emtricitabine  Emtriva   Sensitive   RAMs*: None Lamivudine   Epivir    Sensitive   RAMs*: None Stavudine    Zerit    Sensitive   RAMs*: None Tenofovir     Viread    Sensitive   RAMs*: K70K/E Zidovudine   Retrovir   Sensitive   RAMs*: None NNRTI Efavirenz    Sustiva    Resistant   RAMs*: A98G, K103K/N Etravirine   Intelence  Sensitive   RAMs*: A98G,  V179I Nevirapine   Viramune   Resistant   RAMs*: A98G, K103K/N, V179I Rilpivirine   Edurant   Sensitive   RAMs*: K103K/N INI Bictegravir   Bictegravir Sensitive   RAMs*: None Dolutegravir  Tivicay   Sensitive   RAMs*: None Elvitegravir  Vitekta   Sensitive   RAMs*: None Raltegravir   Isentress  Sensitive   RAMs*: None PI Atazanavir   Reyataz   Sensitive   RAMs*: Q58E, D60E, I62V Atazanavir/r  Reyataz / r Sensitive   RAMs*: Q58E, D60E, I62V Darunavir/r   Prezista / r Sensitive   RAMs*: Q58E Fosamprenavir/r Lexiva / r  Sensitive   RAMs*: Q58E Indinavir/r   Crixivan / r Sensitive   RAMs*: None Lopinavir    Kaletra   Sensitive   RAMs*: None Nelfinavir   Viracept   Sensitive   RAMs*: Q58E Ritonavir    Norvir    Sensitive   RAMs*: None Saquinavir/r  Invirase / r Sensitive   RAMs*: I62V Tipranavir/r  Aptivus / r Sensitive   RAMs*: Q58E *RAMs = Resistance Associated Mutations observed Summary of Mutations Observed: RT: V35I, T39A, K46K/Q, K70K/E, A98G, Q102K, K103K/N,   K122P/Q, I135L, C162S, K173Q, D177E, V179I, R211K,   E248E/D, D250D/E, A272S, R277K, A288S, V293I IN: D25E, S39C, M50I, V113I, T124A, T125A, D167E, V201I,   K215N, V234L PR: K14V, L19R, P39S, R41K, Q58E, D60E, Q61E, I62V, L63P,   K70R, V77I Assessment of drug susceptibility is based upon detected mutations and interpreted using an advanced proprietary algorithm (version 17).    He was started on Biktarvy. Last seen by Dr.Fitgerald in May 2019 and  Was diagnosed with DM in May 2019    DM HTN AIDS Asthma No past surgical History  SH Lives on his own Non smoker, no alcohol or illicit drug  use Works for [employer] active with men/women- No partners in 6 months No pets  Parents live in [place]   Family History  Problem Relation Age of Onset  . Hypertension Father   DM- mother   Allergies  Allergen Reactions  . Mushroom Extract Complex Anaphylaxis   Current meds Biktarvy Proventil Metformin '1000mg'$  BID Hydroxyzine Doxycycline amaryl '2mg'$  daily Januvia '100mg'$  once a day Amlodipine '10mg'$     REVIEW OF SYSTEMS:  Const: negative fever, negative chills, negative weight loss Eyes: negative diplopia or visual changes, negative eye pain ENT: negative coryza, negative sore throat Resp: negative cough, hemoptysis, dyspnea Cards: negative for chest pain, palpitations, lower extremity edema GU: negative for frequency, dysuria and hematuria Skin: rash improving Heme: negative for easy bruising and gum/nose bleeding MS: negative for myalgias, arthralgias, back pain and muscle weakness Neurolo:negative for headaches, dizziness, vertigo, memory problems  Psych: negative for feelings of anxiety, depression   Objective:  VITALS:  BP (!) 140/92 (BP Location: Right Arm, Patient Position: Sitting, Cuff Size: Normal)   Pulse (!) 141   Temp 97.8 F (36.6 C) (Oral)   Ht '5\' 9"'$  (1.753 m)   Wt 195 lb (88.5 kg)   BMI 28.80 kg/m  PHYSICAL EXAM:  General: Alert, cooperative, no distress, appears stated age.  Head: Normocephalic, without obvious abnormality, atraumatic. Eyes: Conjunctivae clear, anicteric sclerae. Pupils are equal Nose: Nares normal. No drainage or sinus tenderness. Throat: Lips, mucosa, and tongue normal. No Thrush Neck: Supple, symmetrical, no adenopathy, thyroid: non tender no carotid bruit and no JVD. Back: No CVA tenderness. Lungs: Clear to auscultation bilaterally. No Wheezing or Rhonchi. No rales. Heart: Regular rate and rhythm, no murmur, rub or gallop.  Abdomen: Soft, non-tender,not distended. Bowel sounds normal. No masses Extremities:  Extremities normal, atraumatic, no cyanosis. No edema. No clubbing Skin: No rashes or lesions. Not Jaundiced Lymph: Cervical, supraclavicular normal. Neurologic: Grossly non-focal Pertinent Labs 05/19/19 LFTS are abnormal IMAGING RESULTS: Health maintenance Vaccination  Vaccine Date last given comment  Influenza    Hepatitis B    Hepatitis A    Prevnar-PCV-13    Pneumovac-PPSV-23    TdaP    HPV    Shingrix ( zoster vaccine)     ______________________  Labs Lab Result  Date comment  HIV VL 40 05/19/19   CD4 351 ( 25%) 05/19/19   Genotype See above Oct 2018   [id number]     HIV antibody     RPR 1;128 05/19/19   Quantiferon Gold     Hep C ab NR 12/31/18   Hepatitis B-ab,ag,c NR 12/31/18   Hepatitis A-IgM, IgG /T     Lipid     GC/CHL     PAP     HB,PLT,Cr, LFT AST171/ALT 421 ALK po4 405/Bili 3.4, cr 0.9 05/19/19     Preventive  Procedure Result  Date comment  colonoscopy     Mammogram     Dental exam     Opthal       Impression/Recommendation ? ?AIDS-here to engage in care last Cd4 from 351 and Vl 40 from 05/19/19- on Biktarvy  Recent Syphilis- skin rash-secondary syphilis- also has abnormal LFT from 05/19/19- RPR 1:128 from 05/19/19-was started on Doxy by his PCP's office as he could not come for penicillin injection- Gave him Benzathine pencillin 2.4 million units im today. Asked him to stop Doxycycline  Will check RPR in 3 months  Abnormal LFTS- could be from syphilis- will need to repeat labs including hepatitis serology. He has no time today to get labs as he has to be at work- will come back next week Need LFTS, HEPA, HEPB , HEPC serology  He needs GC/CHL test but he did not have time to get any test done Will do next  Week (GC/CHL urine, Throat, rectum) Pt says he is not had any sexual encounters in 6 months Says [facility] contacted him for partner tracing and he told them the same He needs anal pap  He needs vaccination update- dont see him having any vaccines- due for  HEPB, Prevnar, HEPA, but will check serology first, HPV , tdaP  Poorly controlled DM- last HBa1c was 11 from 05/19/19- Followed by his PCP- on Metformin '1000mg'$  BID, Januvia '100mg'$  and glimeparide  HTN on amlodipine, lisinopril Was tachycardic in my office today- says he is anxious Will have to check TFT as well ?Discussed with patient in great detail

## 2019-06-01 ENCOUNTER — Ambulatory Visit: Payer: 59

## 2019-06-03 ENCOUNTER — Ambulatory Visit: Payer: 59 | Admitting: Infectious Diseases

## 2019-06-04 ENCOUNTER — Other Ambulatory Visit: Payer: Self-pay

## 2019-06-04 ENCOUNTER — Emergency Department (HOSPITAL_BASED_OUTPATIENT_CLINIC_OR_DEPARTMENT_OTHER)
Admission: EM | Admit: 2019-06-04 | Discharge: 2019-06-04 | Disposition: A | Discharge status: Home / Self Care | Payer: 59 | Attending: Emergency Medicine | Admitting: Emergency Medicine

## 2019-06-04 ENCOUNTER — Encounter (HOSPITAL_BASED_OUTPATIENT_CLINIC_OR_DEPARTMENT_OTHER): Payer: Self-pay | Admitting: Emergency Medicine

## 2019-06-04 DIAGNOSIS — E119 Type 2 diabetes mellitus without complications: Secondary | ICD-10-CM | POA: Insufficient documentation

## 2019-06-04 DIAGNOSIS — Z20828 Contact with and (suspected) exposure to other viral communicable diseases: Secondary | ICD-10-CM | POA: Diagnosis not present

## 2019-06-04 DIAGNOSIS — J45909 Unspecified asthma, uncomplicated: Secondary | ICD-10-CM | POA: Diagnosis not present

## 2019-06-04 DIAGNOSIS — R509 Fever, unspecified: Secondary | ICD-10-CM | POA: Diagnosis not present

## 2019-06-04 DIAGNOSIS — Z79899 Other long term (current) drug therapy: Secondary | ICD-10-CM | POA: Diagnosis not present

## 2019-06-04 DIAGNOSIS — I10 Essential (primary) hypertension: Secondary | ICD-10-CM | POA: Diagnosis not present

## 2019-06-04 DIAGNOSIS — Z7984 Long term (current) use of oral hypoglycemic drugs: Secondary | ICD-10-CM | POA: Insufficient documentation

## 2019-06-04 DIAGNOSIS — Z21 Asymptomatic human immunodeficiency virus [HIV] infection status: Secondary | ICD-10-CM | POA: Diagnosis not present

## 2019-06-04 HISTORY — DX: Syphilis, unspecified: A53.9

## 2019-06-04 HISTORY — DX: Type 2 diabetes mellitus without complications: E11.9

## 2019-06-04 HISTORY — DX: Pneumonia, unspecified organism: J18.9

## 2019-06-04 LAB — SARS CORONAVIRUS 2 AG (30 MIN TAT): SARS Coronavirus 2 Ag: NEGATIVE

## 2019-06-04 NOTE — ED Provider Notes (Signed)
[facility] Provider Note: Leroy Alvarado Caprisha Bridgett, MD, FACEP  CSN: [id number] MRN: [id number] ARRIVAL: 06/04/19 at 0505 ROOM: MH02/MH02   CHIEF COMPLAINT  Fever (TMAX 100.1)   HISTORY OF PRESENT ILLNESS  06/04/19 5:23 AM Crista LuriaFrederick N Addison is a 33 y.o. male with HIV on antiretrovirals.  He had an appointment with his infectious disease physician on the 12th of this month where he was given a 2,400,000 unit Bicillin injection for secondary syphilis.  He is here with a 2-day history of a low-grade fever.  It is been as high as 100.1.  He has been taking ibuprofen and Tylenol with transient relief.  He denies any other symptoms specifically he denies headache, sore throat, nasal congestion, loss of smell or taste, cough, shortness of breath, nausea, vomiting, diarrhea or dysuria.  His CD4 count on the third of this month was 351, his HIV viral load was 40.  Past Medical History:  Diagnosis Date  . Asthma   . Diabetes mellitus without complication (HCC)   . HIV (human immunodeficiency virus infection) (HCC)   . Hypertension   . PNA (pneumonia)   . Syphilis     History reviewed. No pertinent surgical history.  Family History  Problem Relation Age of Onset  . Hypertension Father     Social History   Tobacco Use  . Smoking status: Never Smoker  . Smokeless tobacco: Never Used  Substance Use Topics  . Alcohol use: No  . Drug use: No    Prior to Admission medications   Medication Sig Start Date End Date Taking? Authorizing Provider  acetaminophen (TYLENOL) 325 MG tablet Take 650 mg by mouth every 6 (six) hours as needed.   Yes [provider]  ibuprofen (ADVIL) 600 MG tablet Take 600 mg by mouth every 6 (six) hours as needed for fever.   Yes [provider]  albuterol (PROVENTIL HFA;VENTOLIN HFA) 108 (90 BASE) MCG/ACT inhaler Inhale 1 puff into the lungs every 6 (six) hours as needed for shortness of breath.     [provider]  albuterol  (PROVENTIL) (2.5 MG/3ML) 0.083% nebulizer solution Take 3 mLs (2.5 mg total) by nebulization every 4 (four) hours as needed for wheezing or shortness of breath. 09/19/15   Erin FullingKasa, Kurian, MD  amLODipine (NORVASC) 10 MG tablet Take 1 tablet by mouth daily. 05/06/19   [provider]  BIKTARVY 50-200-25 MG TABS tablet Take 1 tablet by mouth daily. 05/06/19   [provider]  fluconazole (DIFLUCAN) 200 MG tablet Take 1 tablet (200 mg total) by mouth daily. 12/20/15   Benjiman CorePickering, Nathan, MD  hydrOXYzine (ATARAX/VISTARIL) 25 MG tablet Take 1 tablet (25 mg total) by mouth every 8 (eight) hours as needed for itching. 05/13/19   Felicie MornSmith, David, NP  JANUVIA 100 MG tablet Take 100 mg by mouth daily. 12/31/18   [provider]  lisinopril (PRINIVIL,ZESTRIL) 20 MG tablet Take 1 tablet (20 mg total) by mouth daily. 12/20/15   Benjiman CorePickering, Nathan, MD  metFORMIN (GLUCOPHAGE) 500 MG tablet Take by mouth 2 (two) times daily with a meal.    [provider]  mometasone-formoterol (DULERA) 200-5 MCG/ACT AERO Inhale 2 puffs into the lungs 2 (two) times daily. 09/08/15   Altamese DillingVachhani, Vaibhavkumar, MD    Allergies Mushroom extract complex   REVIEW OF SYSTEMS  Negative except as noted here or in the History of Present Illness.   PHYSICAL EXAMINATION  Initial Vital Signs Blood pressure (!) 143/89, pulse 100, temperature 99.6 F (37.6  C), temperature source Oral, resp. rate 16, height 5\' 9"  (1.753 m), weight 90.3 kg, SpO2 96 %.  Examination General: Well-developed, well-nourished male in no acute distress; appearance consistent with age of record HENT: normocephalic; atraumatic; pharynx normal Eyes: pupils equal, round and reactive to light; extraocular muscles intact Neck: supple Heart: regular rate and rhythm Lungs: clear to auscultation bilaterally Abdomen: soft; nondistended; nontender; no masses or hepatosplenomegaly; bowel sounds present Extremities: No deformity; full range of motion;  pulses normal Neurologic: Awake, alert and oriented; motor function intact in all extremities and symmetric; no facial droop Skin: Warm and dry Psychiatric: Normal mood and affect   RESULTS  Summary of this visit's results, reviewed by myself:   EKG Interpretation  Date/Time:    Ventricular Rate:    PR Interval:    QRS Duration:   QT Interval:    QTC Calculation:   R Axis:     Text Interpretation:        Laboratory Studies: Results for orders placed or performed during the hospital encounter of 06/04/19 (from the past 24 hour(s))  SARS Coronavirus 2 (Hosp order,Performed in [place] lab via [employer] ID)     Status: None   Collection Time: 06/04/19  5:33 AM   Specimen: Dry Nasal Swab ([facility] ID Now)  Result Value Ref Range   SARS Coronavirus 2 ([facility] ID Now) NEGATIVE NEGATIVE   Imaging Studies: No results found.  ED COURSE and MDM  Nursing notes and initial vitals signs, including pulse oximetry, reviewed.  Vitals:   06/04/19 0515  BP: (!) 143/89  Pulse: 100  Resp: 16  Temp: 99.6 F (37.6 C)  TempSrc: Oral  SpO2: 96%  Weight: 90.3 kg  Height: 5\' 9"  (1.753 m)   TRYSTEN BERNARD was evaluated in Emergency Department on 06/04/2019 for the symptoms described in the history of present illness. He was evaluated in the context of the global COVID-19 pandemic, which necessitated consideration that the patient might be at risk for infection with the SARS-CoV-2 virus that causes COVID-19. Institutional protocols and algorithms that pertain to the evaluation of patients at risk for COVID-19 are in a state of rapid change based on information released by regulatory bodies including the CDC and federal and state organizations. These policies and algorithms were followed during the patient's care in the ED.  The Abbott COVID-19 test here is negative.  Patient was advised that the Abbott test is not 100% sensitive and we will send a confirmatory outpatient test.  His symptoms are  atypical for Jarisch-Herxheimer reaction as this usually occurs within hours of syphilis treatment, not days.  PROCEDURES    ED DIAGNOSES     ICD-10-CM   1. Fever in adult  R50.9        Lunden Stieber, Jenny Reichmann, MD 06/04/19 [phone]

## 2019-06-04 NOTE — ED Triage Notes (Signed)
Reports fever x3 days, reports between 99-100. Temp this morning 100.1. Denies other s/s. HIV hx

## 2019-06-04 NOTE — ED Notes (Signed)
ED Provider at bedside. 

## 2019-06-05 LAB — NOVEL CORONAVIRUS, NAA (HOSP ORDER, SEND-OUT TO REF LAB; TAT 18-24 HRS): SARS-CoV-2, NAA: NOT DETECTED

## 2019-12-01 NOTE — Progress Notes (Signed)
This encounter was created in error - please disregard.

## 2020-02-04 DIAGNOSIS — F411 Generalized anxiety disorder: Secondary | ICD-10-CM | POA: Insufficient documentation

## 2020-02-10 ENCOUNTER — Other Ambulatory Visit: Payer: Self-pay | Admitting: Infectious Diseases

## 2020-02-10 MED ORDER — BIKTARVY 50-200-25 MG PO TABS: 1.0000 | ORAL_TABLET | Freq: Every day | ORAL | 1 refills | Status: AC

## 2020-02-10 NOTE — Progress Notes (Signed)
Pt has not seen me since his first visit in June 2020. Called the [facility] and asked them to let him know to make an appt with me- Gave a month supply of biktarvy with 1 refill

## 2020-04-26 ENCOUNTER — Other Ambulatory Visit: Payer: Self-pay | Admitting: Infectious Diseases

## 2020-04-26 MED ORDER — BIKTARVY 50-200-25 MG PO TABS: 1.0000 | ORAL_TABLET | Freq: Every day | ORAL | 0 refills | Status: DC

## 2020-04-26 NOTE — Progress Notes (Signed)
Pt has not been seen since June 2020. [facility] called asking for [employer] refill- gave 1 month supply. They have left him a message for him to make appt. With me -

## 2020-05-22 ENCOUNTER — Other Ambulatory Visit: Payer: Self-pay | Admitting: Infectious Diseases

## 2020-05-22 DIAGNOSIS — B20 Human immunodeficiency virus [HIV] disease: Secondary | ICD-10-CM

## 2020-06-15 ENCOUNTER — Telehealth: Payer: Self-pay | Admitting: Infectious Diseases

## 2020-06-20 ENCOUNTER — Other Ambulatory Visit
Admission: RE | Admit: 2020-06-20 | Discharge: 2020-06-20 | Disposition: A | Discharge status: Home / Self Care | Payer: 59 | Source: Ambulatory Visit | Attending: Infectious Diseases | Admitting: Infectious Diseases

## 2020-06-20 ENCOUNTER — Ambulatory Visit: Payer: 59 | Attending: Infectious Diseases | Admitting: Infectious Diseases

## 2020-06-20 ENCOUNTER — Other Ambulatory Visit: Payer: Self-pay

## 2020-06-20 ENCOUNTER — Ambulatory Visit: Payer: 59 | Admitting: Infectious Diseases

## 2020-06-20 ENCOUNTER — Encounter: Payer: Self-pay | Admitting: Infectious Diseases

## 2020-06-20 ENCOUNTER — Other Ambulatory Visit: Payer: Self-pay | Admitting: Infectious Diseases

## 2020-06-20 VITALS — BP 123/84 | HR 92 | Temp 97.5°F | Resp 16 | Ht 69.0 in | Wt 205.0 lb

## 2020-06-20 DIAGNOSIS — Z79899 Other long term (current) drug therapy: Secondary | ICD-10-CM | POA: Diagnosis not present

## 2020-06-20 DIAGNOSIS — E118 Type 2 diabetes mellitus with unspecified complications: Secondary | ICD-10-CM | POA: Diagnosis not present

## 2020-06-20 DIAGNOSIS — Z7901 Long term (current) use of anticoagulants: Secondary | ICD-10-CM | POA: Diagnosis not present

## 2020-06-20 DIAGNOSIS — J449 Chronic obstructive pulmonary disease, unspecified: Secondary | ICD-10-CM | POA: Diagnosis not present

## 2020-06-20 DIAGNOSIS — Z794 Long term (current) use of insulin: Secondary | ICD-10-CM | POA: Diagnosis not present

## 2020-06-20 DIAGNOSIS — B20 Human immunodeficiency virus [HIV] disease: Secondary | ICD-10-CM | POA: Insufficient documentation

## 2020-06-20 DIAGNOSIS — Z7984 Long term (current) use of oral hypoglycemic drugs: Secondary | ICD-10-CM

## 2020-06-20 DIAGNOSIS — A549 Gonococcal infection, unspecified: Secondary | ICD-10-CM

## 2020-06-20 DIAGNOSIS — I10 Essential (primary) hypertension: Secondary | ICD-10-CM | POA: Insufficient documentation

## 2020-06-20 DIAGNOSIS — E119 Type 2 diabetes mellitus without complications: Secondary | ICD-10-CM

## 2020-06-20 LAB — COMPREHENSIVE METABOLIC PANEL
ALT: 15 U/L (ref 0–44)
AST: 17 U/L (ref 15–41)
Albumin: 4.3 g/dL (ref 3.5–5.0)
Alkaline Phosphatase: 52 U/L (ref 38–126)
Anion gap: 9 (ref 5–15)
BUN: 16 mg/dL (ref 6–20)
CO2: 27 mmol/L (ref 22–32)
Calcium: 9.3 mg/dL (ref 8.9–10.3)
Chloride: 102 mmol/L (ref 98–111)
Creatinine, Ser: 1.02 mg/dL (ref 0.61–1.24)
GFR calc Af Amer: >60 mL/min (ref >60)
GFR calc non Af Amer: >60 mL/min (ref >60)
Glucose, Bld: 230 mg/dL — ABNORMAL HIGH (ref 70–99)
Potassium: 4.1 mmol/L (ref 3.5–5.1)
Sodium: 138 mmol/L (ref 135–145)
Total Bilirubin: 0.8 mg/dL (ref 0.3–1.2)
Total Protein: 7.8 g/dL (ref 6.5–8.1)

## 2020-06-20 LAB — CHLAMYDIA/NGC RT PCR (ARMC ONLY)
Chlamydia Tr: NOT DETECTED
Chlamydia Tr: NOT DETECTED — AB
N gonorrhoeae: NOT DETECTED
N gonorrhoeae: DETECTED — AB

## 2020-06-20 LAB — HEPATITIS A ANTIBODY, TOTAL: hep A Total Ab: NONREACTIVE

## 2020-06-20 LAB — HEPATITIS B CORE ANTIBODY, TOTAL: Hep B Core Total Ab: NONREACTIVE

## 2020-06-20 LAB — HEPATITIS C ANTIBODY: HCV Ab: NONREACTIVE

## 2020-06-20 LAB — HEPATITIS B SURFACE ANTIGEN: Hepatitis B Surface Ag: NONREACTIVE

## 2020-06-20 MED ORDER — CEFTRIAXONE SODIUM 250 MG IJ SOLR
500.0000 mg | Freq: Once | INTRAMUSCULAR | Status: AC
  Administered 2020-06-26: 500 mg via INTRAMUSCULAR

## 2020-06-20 MED ORDER — BIKTARVY 50-200-25 MG PO TABS: 1.0000 | ORAL_TABLET | Freq: Every day | ORAL | 3 refills | Status: DC

## 2020-06-20 MED ORDER — AZITHROMYCIN 1 G PO PACK: 1.0000 g | PACK | Freq: Once | ORAL | Status: AC

## 2020-06-20 NOTE — Patient Instructions (Signed)
Are here for follow up - today we will do labs- I have sent a prescription for azithromycin and also biktarvy. Will call you with results and also will let you know if you need ceftriaxone injection

## 2020-06-20 NOTE — Progress Notes (Signed)
Gonorrhea positive inurine- pt called to come and get ceftriaxone 500mg  IM one dose- HE needs a 4pm appt- let know to schedule a convenient time with him

## 2020-06-20 NOTE — Progress Notes (Signed)
NAME: Leroy Alvarado  DOB: [birth year]  MRN: [id number]  Date/Time: 06/20/2020 9:08 AM   Subjective:  Follow up visit- last seen 0n June 13-2020   Leroy Alvarado is a 34 y.o.male  with a history of AIDs, diabetes mellitus, hypertension is here for follow-up visit.  The last time I saw him was May 29, 2019.  During that visit he was treated with benzathine penicillin for secondary syphilis. He has not had any labs recently. He has seen his PCP many times.  He is also following with endocrinologist in [place] clinic.  His diabetic medications have been changed and now he is on dulaglutide, Metformin, Lantus. He takes amlodipine for his blood pressure During his visit to his PCP in April 2021 he was given a prescription for doxycycline for possible contact to gonorrhea/chlamydia. Patient now complains of some penile discharge .  Patient is currently on Biktarvy and is 100% adherent.  He lives in [place].  He says he is fully vaccinated for coronavirus   HIV diagnosed 2016 when he was in [place] and had pneumonia-  He saw Dr.Fitzgerald in the [place] [facility] on 09/30/15 and was started on Genvoya Nadir Cd4 7 VL [id number]  OI -PCP HAARt history- started on Genvoya and now on Biktarvy Acquired thru sex  Genosure prime done on 11/2015 showed K 103N/ causing NNRTI resistance to viramune, efivarenz NRTI AbacavirZiagen Sensitive  RAMs*: None DidanosineVidexSensitive  RAMs*: None Emtricitabine EmtrivaSensitive  RAMs*: None LamivudineEpivir Sensitive  RAMs*: None Stavudine ZeritSensitive  RAMs*: None Tenofovir Viread Sensitive  RAMs*: None ZidovudineRetrovir Sensitive  RAMs*: None NNRTI Efavirenz Sustiva Resistant  RAMs*: A98G, K103N EtravirineIntelenceSensitive  RAMs*: A98G, V179I NevirapineViramuneResistant  RAMs*: A98G, K103N,  V179I Rilpivirine EdurantSensitive  RAMs*: K103N INI DolutegravirTivicaySensitive  RAMs*: None ElvitegravirVitektaSensitive  RAMs*: None Raltegravir IsentressSensitive  RAMs*: None PI AtazanavirReyatazSensitive  RAMs*: Q58E, D60E, I62V Atazanavir/rReyataz / rSensitive  RAMs*: Q58E, D60E, I62V Darunavir/r Prezista / r Sensitive  RAMs*: Q58E Fosamprenavir/r Lexiva / r Sensitive  RAMs*: Q58E Indinavir/r Crixivan / r Sensitive  RAMs*: None Lopinavir KaletraSensitive  RAMs*: None NelfinavirViracept Sensitive  RAMs*: Q58E Ritonavir Norvir Sensitive  RAMs*: None Saquinavir/rInvirase / r Sensitive  RAMs*: I62V Tipranavir/rAptivus / rSensitive  RAMs*: Q58E *RAMs = Resistance Associated Mutations observed Summary of Mutations Observed: RT: V35I, T39A, A98G, Q102K, K103N, K122Q, I135L, C162S, K173Q, D177E, V179I, R211K, E248D, A272S, R277K, A288S, V293I IN: D25E, S39C, M50I, V113I, T124A, T125A, D167E, V201I, K215N, V234L PR: K14V, L19R, P39S, R41K, Q58E, D60E, Q61E, I62V, L63P, K70K/R, I72I/L, V77I Assessment of drug susceptibility is based upon detected mutations and interpreted using an advanced proprietary algorithm (version 16).   HIV GenoSure PRIme(R) Interp - [facility] Comment  Comment: Interpretation algorithms for ritonavir-boosted protease inhibitors appropriate for the following dosages: AMP/r 600mg /100mg  BID; ATV/r 300mg /100mg  QD; IDV/r 800mg /200mg  BID; LPV/r 400mg /100mg  BID; SQV/r 1000mg /100mg  BID; TPV/r 500mg /200mg  BID; and DRV/r 600mg /100mg  BID.   He was started on genvoya- But he did not follow up for 2 years  And had stopped genvoya after taking it for 1 1/2 yrs as he continued to be detectable and in OCT 2018 the VL was 277,000. He had another genotype which showed K70K/E but no resistance to NRTI  /PI/Integrase inhibitor  OCT 2018 ? NRTI Abacavir    Ziagen    Sensitive   RAMs*: K70K/E Didanosine   Videx    Sensitive   RAMs*: K70K/E Emtricitabine  Emtriva   Sensitive   RAMs*: None Lamivudine   Epivir    Sensitive  RAMs*: None Stavudine    Zerit    Sensitive   RAMs*: None Tenofovir    Viread    Sensitive   RAMs*: K70K/E Zidovudine   Retrovir   Sensitive   RAMs*: None NNRTI Efavirenz    Sustiva    Resistant   RAMs*: A98G, K103K/N Etravirine   Intelence  Sensitive   RAMs*: A98G, V179I Nevirapine   Viramune   Resistant   RAMs*: A98G, K103K/N, V179I Rilpivirine   Edurant   Sensitive   RAMs*: K103K/N INI Bictegravir   Bictegravir Sensitive   RAMs*: None Dolutegravir  Tivicay   Sensitive   RAMs*: None Elvitegravir  Vitekta   Sensitive   RAMs*: None Raltegravir   Isentress  Sensitive   RAMs*: None PI Atazanavir   Reyataz   Sensitive   RAMs*: Q58E, D60E, I62V Atazanavir/r  Reyataz / r Sensitive   RAMs*: Q58E, D60E, I62V Darunavir/r   Prezista / r Sensitive   RAMs*: Q58E Fosamprenavir/r Lexiva / r  Sensitive   RAMs*: Q58E Indinavir/r   Crixivan / r Sensitive   RAMs*: None Lopinavir    Kaletra   Sensitive   RAMs*: None Nelfinavir   Viracept   Sensitive   RAMs*: Q58E Ritonavir    Norvir    Sensitive   RAMs*: None Saquinavir/r  Invirase / r Sensitive   RAMs*: I62V Tipranavir/r  Aptivus / r Sensitive   RAMs*: Q58E *RAMs = Resistance Associated Mutations observed Summary of Mutations Observed: RT: V35I, T39A, K46K/Q, K70K/E, A98G, Q102K, K103K/N,   K122P/Q, I135L, C162S, K173Q, D177E, V179I, R211K,   E248E/D, D250D/E, A272S, R277K, A288S, V293I IN: D25E, S39C, M50I, V113I, T124A, T125A, D167E, V201I,   K215N, V234L PR: K14V, L19R, P39S, R41K, Q58E, D60E, Q61E, I62V, L63P,   K70R, V77I Assessment of drug  susceptibility is based upon detected mutations and interpreted using an advanced proprietary algorithm (version 17).    He was started on Biktarvy.  in May 2019  Was diagnosed with DM in May 2019    DM HTN AIDS Asthma No past surgical History  SH Lives on his own Non smoker, no alcohol or illicit drug use Works for Environmental health practitioner active with men/women- No partners in 6 months No pets  Parents live in [place]   Family History  Problem Relation Age of Onset  . Hypertension Father   DM- mother   Allergies  Allergen Reactions  . Mushroom Extract Complex Anaphylaxis   Medication Amlodipine 10 mg Vitamin C Biktarvy Celexa Insulin as per time Insulin lispro as needed Lantus 24 units subcutaneously nightly Metformin 1000 mg tablet twice daily Mometasone formoterol Dulera inhaler Albuterol inhaler as needed Dulaglutide 1.5 mg subcutaneous injection every 7 days    REVIEW OF SYSTEMS:  Const: negative fever, negative chills, negative weight loss Eyes: negative diplopia or visual changes, negative eye pain ENT: negative coryza, negative sore throat Resp: negative cough, hemoptysis, dyspnea Cards: negative for chest pain, palpitations, lower extremity edema GU: negative for frequency, dysuria and hematuria.  Has some penile discharge Skin: No rash Heme: negative for easy bruising and gum/nose bleeding MS: negative for myalgias, arthralgias, back pain and muscle weakness Neurolo:negative for headaches, dizziness, vertigo, memory problems  Psych:  anxiety  Objective:  VITALS:  BP 123/84   Pulse 92   Temp (!) 97.5 F (36.4 C)   Resp 16   Ht 5\' 9"  (1.753 m)   Wt 205 lb (93 kg)   SpO2 98%   BMI 30.27 kg/m  PHYSICAL  EXAM:  General: Alert, cooperative, no distress, appears stated age.  Head: Normocephalic, without obvious abnormality, atraumatic. Eyes: Conjunctivae clear, anicteric sclerae. Pupils are equal Nose: Nares normal. No drainage or sinus  tenderness. Throat: Lips, mucosa, and tongue normal. No Thrush Neck: Supple, symmetrical, no adenopathy, thyroid: non tender no carotid bruit and no JVD. Back: No CVA tenderness. Lungs: Clear to auscultation bilaterally. No Wheezing or Rhonchi. No rales. Heart: Regular rate and rhythm, no murmur, rub or gallop. Abdomen: Soft, non-tender,not distended. Bowel sounds normal. No masses There is no white milky penile discharge.  There is just mild wetness Extremities: Extremities normal, atraumatic, no cyanosis. No edema. No clubbing Skin: No rashes or lesions. Not Jaundiced Lymph: Cervical, supraclavicular normal. Neurologic: Grossly non-focal Pertinent Labs Done at [facility] 02/04/2020 hemoglobin A1c is 10.3. IMAGING RESULTS: Health maintenance Vaccination  Vaccine Date last given comment  Influenza    Hepatitis B    Hepatitis A    Prevnar-PCV-13    Pneumovac-PPSV-23    TdaP    HPV    Shingrix ( zoster vaccine)     ______________________  Labs Lab Result  Date comment  HIV VL 40 05/19/19   CD4 351 ( 25%) 05/19/19   Genotype See above Oct 2018   [id number]     HIV antibody     RPR 1;128 05/19/19   Quantiferon Gold     Hep C ab NR 12/31/18   Hepatitis B-ab,ag,c NR 12/31/18   Hepatitis A-IgM, IgG /T     Lipid     GC/CHL     PAP     HB,PLT,Cr, LFT       Preventive  Procedure Result  Date comment  colonoscopy     Mammogram     Dental exam     Opthal       Impression/Recommendation ? ?AIDS on Biktarvy.  100% adherent last CD4 is 351 and viral load is 40 from 05/19/2019. We will do labs today  Treated syphilis- -secondary syphilis with skin rash and June 2020- also has abnormal LFT from 05/19/19- RPR 1:128 from 05/19/19-was started on Doxy by his PCP's office as he could not come for penicillin injection-received benzathine penicillin 2,400,000 units on 05/29/2019.  He never showed up for repeat labs or today we will do an RPR  Penile discharge.  Not milky, like GC for the  last few weeks.  We will have to check for chlamydia.  Check GC chlamydia.  Urine PCR. Sent a prescription for azithromycin.  We will likely treat him with ceftriaxone if gonorrhea is positive as well.  Abnormal LFTS-on 05/19/2019 which is normalized since then.   GC chlamydia swab sent from the throat and urine.  Patient refused rectal swab as he said he does not participate in receptive anal sex. He did not want anal pap  He needs vaccination update- dont see him having any vaccines- due for HEPB, Prevnar, HEPA, will check serology today,  Diabetes mellitus: On multiple medications as listed above last hemoglobin A1c is 10.3.  Followed by endocrinologist. HTN on amlodipine ?  Currently the follow-up appointment has been given for 6 months but we will need an earlier appointment if any of his labs are abnormal. Discussed with patient in great detail.

## 2020-06-21 LAB — T-HELPER CELLS CD4/CD8 %
% CD 4 Pos. Lymph.: 26.2 % — ABNORMAL LOW (ref 30.8–58.5)
Absolute CD 4 Helper: 367 /uL (ref 359–1519)
Basophils Absolute: 0 10*3/uL (ref 0.0–0.2)
Basos: 1 %
CD3+CD4+ Cells/CD3+CD8+ Cells Bld: 0.74 — ABNORMAL LOW (ref 0.92–3.72)
CD3+CD8+ Cells # Bld: 496 /uL (ref 109–897)
CD3+CD8+ Cells NFr Bld: 35.4 % (ref 12.0–35.5)
EOS (ABSOLUTE): 0 10*3/uL (ref 0.0–0.4)
Eos: 0 %
Hematocrit: 44.7 % (ref 37.5–51.0)
Hemoglobin: 15.6 g/dL (ref 13.0–17.7)
Immature Grans (Abs): 0 10*3/uL (ref 0.0–0.1)
Immature Granulocytes: 0 %
Lymphocytes Absolute: 1.4 10*3/uL (ref 0.7–3.1)
Lymphs: 39 %
MCH: 31.3 pg (ref 26.6–33.0)
MCHC: 34.9 g/dL (ref 31.5–35.7)
MCV: 90 fL (ref 79–97)
Monocytes Absolute: 0.4 10*3/uL (ref 0.1–0.9)
Monocytes: 10 %
Neutrophils Absolute: 1.8 10*3/uL (ref 1.4–7.0)
Neutrophils: 50 %
Platelets: 348 10*3/uL (ref 150–450)
RBC: 4.98 x10E6/uL (ref 4.14–5.80)
RDW: 12.9 % (ref 11.6–15.4)
WBC: 3.7 10*3/uL (ref 3.4–10.8)

## 2020-06-21 LAB — HIV-1 RNA QUANT-NO REFLEX-BLD
HIV 1 RNA Quant: 30 copies/mL
LOG10 HIV-1 RNA: 1.477 log10copy/mL

## 2020-06-21 LAB — HEPATITIS B SURFACE ANTIBODY, QUANTITATIVE: Hep B S AB Quant (Post): 6.2 m[IU]/mL — ABNORMAL LOW (ref >9.9)

## 2020-06-21 LAB — RPR
RPR Ser Ql: REACTIVE — AB
RPR Titer: 1:1 {titer}

## 2020-06-21 NOTE — Progress Notes (Signed)
RPR is 1:1 - much better than 1:128 he had last year and was treated and he has responded Cd4 is 367 ( 26%)

## 2020-06-22 ENCOUNTER — Other Ambulatory Visit: Payer: Self-pay

## 2020-06-22 DIAGNOSIS — A549 Gonococcal infection, unspecified: Secondary | ICD-10-CM

## 2020-06-22 LAB — QUANTIFERON-TB GOLD PLUS: QuantiFERON-TB Gold Plus: NEGATIVE

## 2020-06-22 LAB — QUANTIFERON-TB GOLD PLUS (RQFGPL)
QuantiFERON Mitogen Value: >10 IU/mL
QuantiFERON Nil Value: 0.06 IU/mL
QuantiFERON TB1 Ag Value: 0.06 IU/mL
QuantiFERON TB2 Ag Value: 0.04 IU/mL

## 2020-06-22 LAB — T.PALLIDUM AB, TOTAL: T Pallidum Abs: REACTIVE — AB

## 2020-06-22 MED ORDER — AZITHROMYCIN 1 G PO PACK: 1.0000 g | PACK | Freq: Once | ORAL | 0 refills | Status: AC

## 2020-06-22 NOTE — Telephone Encounter (Signed)
Have left 2 vm telling patient we need to schedule for nurse visit for Abx shot.

## 2020-06-26 ENCOUNTER — Ambulatory Visit: Payer: 59 | Attending: Infectious Diseases | Admitting: Infectious Diseases

## 2020-06-26 DIAGNOSIS — A549 Gonococcal infection, unspecified: Secondary | ICD-10-CM

## 2020-07-11 ENCOUNTER — Ambulatory Visit: Payer: 59 | Admitting: Infectious Diseases

## 2020-12-21 ENCOUNTER — Ambulatory Visit: Payer: 59 | Admitting: Infectious Diseases

## 2021-01-04 ENCOUNTER — Encounter: Payer: Self-pay | Admitting: Infectious Diseases

## 2021-01-04 ENCOUNTER — Telehealth: Payer: Self-pay

## 2021-01-04 ENCOUNTER — Other Ambulatory Visit
Admission: RE | Admit: 2021-01-04 | Discharge: 2021-01-04 | Disposition: A | Discharge status: Home / Self Care | Payer: 59 | Source: Ambulatory Visit | Attending: Infectious Diseases | Admitting: Infectious Diseases

## 2021-01-04 ENCOUNTER — Ambulatory Visit: Payer: 59 | Attending: Infectious Diseases | Admitting: Infectious Diseases

## 2021-01-04 ENCOUNTER — Other Ambulatory Visit: Payer: Self-pay

## 2021-01-04 VITALS — BP 142/94 | HR 101 | Temp 97.8°F | Resp 16 | Ht 69.0 in | Wt 200.0 lb

## 2021-01-04 DIAGNOSIS — Z79899 Other long term (current) drug therapy: Secondary | ICD-10-CM | POA: Diagnosis not present

## 2021-01-04 DIAGNOSIS — R7989 Other specified abnormal findings of blood chemistry: Secondary | ICD-10-CM

## 2021-01-04 DIAGNOSIS — Z8619 Personal history of other infectious and parasitic diseases: Secondary | ICD-10-CM | POA: Diagnosis not present

## 2021-01-04 DIAGNOSIS — Z7984 Long term (current) use of oral hypoglycemic drugs: Secondary | ICD-10-CM | POA: Diagnosis not present

## 2021-01-04 DIAGNOSIS — E119 Type 2 diabetes mellitus without complications: Secondary | ICD-10-CM | POA: Diagnosis not present

## 2021-01-04 DIAGNOSIS — B20 Human immunodeficiency virus [HIV] disease: Secondary | ICD-10-CM | POA: Diagnosis not present

## 2021-01-04 DIAGNOSIS — I10 Essential (primary) hypertension: Secondary | ICD-10-CM | POA: Diagnosis not present

## 2021-01-04 LAB — CHLAMYDIA/NGC RT PCR (ARMC ONLY)
Chlamydia Tr: NOT DETECTED
N gonorrhoeae: NOT DETECTED

## 2021-01-04 MED ORDER — BIKTARVY 50-200-25 MG PO TABS: 1.0000 | ORAL_TABLET | Freq: Every day | ORAL | 6 refills | Status: DC

## 2021-01-04 NOTE — Telephone Encounter (Signed)
Advised urine test neg

## 2021-01-04 NOTE — Progress Notes (Signed)
NAME: Leroy Alvarado  DOB: [birth year]  MRN: [id number]  Date/Time: 01/04/2021 9:16 AM   Subjective:  Follow up visit- last seen 0n July 2021   Leroy Alvarado is a 35 y.o.male  with a history of AIDs, diabetes mellitus, hypertension is here for follow-up visit.  The last visit I treated him for GC urethritis  Patient is currently on Biktarvy and is 100% adherent.  He lives in [place].  He says he is fully vaccinated for coronavirus and got his booster. He is also following with endocrinologist in [place] [facility].  His diabetic medications have been changed and now he is on dulaglutide, Metformin. He takes amlodipine for his blood pressure  .    HIV diagnosed 2016 when he was in [place] and had pneumonia-  He saw Dr.Fitzgerald in the [place] clinic on 09/30/15 and was started on Genvoya Nadir Cd4 7 VL -[id number]  OI -PCP HAARt history- started on Genvoya and now on Biktarvy Acquired thru sex  Genosure prime done on 11/2015 showed K 103N/ causing NNRTI resistance to viramune, efivarenz NRTI AbacavirZiagen Sensitive  RAMs*: None DidanosineVidexSensitive  RAMs*: None Emtricitabine EmtrivaSensitive  RAMs*: None LamivudineEpivir Sensitive  RAMs*: None Stavudine ZeritSensitive  RAMs*: None Tenofovir Viread Sensitive  RAMs*: None ZidovudineRetrovir Sensitive  RAMs*: None NNRTI Efavirenz Sustiva Resistant  RAMs*: A98G, K103N EtravirineIntelenceSensitive  RAMs*: A98G, V179I NevirapineViramuneResistant  RAMs*: A98G, K103N, V179I Rilpivirine EdurantSensitive  RAMs*: K103N INI DolutegravirTivicaySensitive  RAMs*: None ElvitegravirVitektaSensitive  RAMs*: None Raltegravir IsentressSensitive  RAMs*: None PI AtazanavirReyatazSensitive  RAMs*: Q58E, D60E,  I62V Atazanavir/rReyataz / rSensitive  RAMs*: Q58E, D60E, I62V Darunavir/r Prezista / r Sensitive  RAMs*: Q58E Fosamprenavir/r Lexiva / r Sensitive  RAMs*: Q58E Indinavir/r Crixivan / r Sensitive  RAMs*: None Lopinavir KaletraSensitive  RAMs*: None NelfinavirViracept Sensitive  RAMs*: Q58E Ritonavir Norvir Sensitive  RAMs*: None Saquinavir/rInvirase / r Sensitive  RAMs*: I62V Tipranavir/rAptivus / rSensitive  RAMs*: Q58E *RAMs = Resistance Associated Mutations observed Summary of Mutations Observed: RT: V35I, T39A, A98G, Q102K, K103N, K122Q, I135L, C162S, K173Q, D177E, V179I, R211K, E248D, A272S, R277K, A288S, V293I IN: D25E, S39C, M50I, V113I, T124A, T125A, D167E, V201I, K215N, V234L PR: K14V, L19R, P39S, R41K, Q58E, D60E, Q61E, I62V, L63P, K70K/R, I72I/L, V77I Assessment of drug susceptibility is based upon detected mutations and interpreted using an advanced proprietary algorithm (version 16).   HIV GenoSure PRIme(R) Interp - [facility] Comment  Comment: Interpretation algorithms for ritonavir-boosted protease inhibitors appropriate for the following dosages: AMP/r 600mg /100mg  BID; ATV/r 300mg /100mg  QD; IDV/r 800mg /200mg  BID; LPV/r 400mg /100mg  BID; SQV/r 1000mg /100mg  BID; TPV/r 500mg /200mg  BID; and DRV/r 600mg /100mg  BID.   He was started on genvoya- But he did not follow up for 2 years  And had stopped genvoya after taking it for 1 1/2 yrs as he continued to be detectable and in OCT 2018 the VL was 277,000. He had another genotype which showed K70K/E but no resistance to NRTI /PI/Integrase inhibitor  OCT 2018 ? NRTI Abacavir    Ziagen    Sensitive   RAMs*: K70K/E Didanosine   Videx    Sensitive   RAMs*: K70K/E Emtricitabine  Emtriva   Sensitive   RAMs*: None Lamivudine   Epivir    Sensitive   RAMs*: None Stavudine    Zerit    Sensitive    RAMs*: None Tenofovir    Viread    Sensitive   RAMs*: K70K/E Zidovudine   Retrovir   Sensitive   RAMs*: None NNRTI Efavirenz    Sustiva    Resistant   RAMs*:  A98G, K103K/N Etravirine   Intelence  Sensitive   RAMs*: A98G, V179I Nevirapine   Viramune   Resistant   RAMs*: A98G, K103K/N, V179I Rilpivirine   Edurant   Sensitive   RAMs*: K103K/N INI Bictegravir   Bictegravir Sensitive   RAMs*: None Dolutegravir  Tivicay   Sensitive   RAMs*: None Elvitegravir  Vitekta   Sensitive   RAMs*: None Raltegravir   Isentress  Sensitive   RAMs*: None PI Atazanavir   Reyataz   Sensitive   RAMs*: Q58E, D60E, I62V Atazanavir/r  Reyataz / r Sensitive   RAMs*: Q58E, D60E, I62V Darunavir/r   Prezista / r Sensitive   RAMs*: Q58E Fosamprenavir/r Lexiva / r  Sensitive   RAMs*: Q58E Indinavir/r   Crixivan / r Sensitive   RAMs*: None Lopinavir    Kaletra   Sensitive   RAMs*: None Nelfinavir   Viracept   Sensitive   RAMs*: Q58E Ritonavir    Norvir    Sensitive   RAMs*: None Saquinavir/r  Invirase / r Sensitive   RAMs*: I62V Tipranavir/r  Aptivus / r Sensitive   RAMs*: Q58E *RAMs = Resistance Associated Mutations observed Summary of Mutations Observed: RT: V35I, T39A, K46K/Q, K70K/E, A98G, Q102K, K103K/N,   K122P/Q, I135L, C162S, K173Q, D177E, V179I, R211K,   E248E/D, D250D/E, A272S, R277K, A288S, V293I IN: D25E, S39C, M50I, V113I, T124A, T125A, D167E, V201I,   K215N, V234L PR: K14V, L19R, P39S, R41K, Q58E, D60E, Q61E, I62V, L63P,   K70R, V77I Assessment of drug susceptibility is based upon detected mutations and interpreted using an advanced proprietary algorithm (version 17).    He was started on Biktarvy.  in May 2019  Was diagnosed with DM in May 2019    DM HTN AIDS Asthma No past surgical History  SH Lives on his own Non smoker, no alcohol or illicit drug  use Works for Environmental health practitioner active with men/women- No partners in 6 months No pets  Parents live in [place]   Family History  Problem Relation Age of Onset  . Hypertension Father   DM- mother   Allergies  Allergen Reactions  . Mushroom Extract Complex Anaphylaxis   Medication Amlodipine 10 mg Vitamin C Biktarvy Celexa Metformin 1000 mg tablet twice daily Albuterol inhaler as needed Dulaglutide 1.5 mg subcutaneous injection every 7 days Elderberry   REVIEW OF SYSTEMS:  Const: negative fever, negative chills, negative weight loss Eyes: negative diplopia or visual changes, negative eye pain ENT: negative coryza, negative sore throat Resp: negative cough, hemoptysis, dyspnea Cards: negative for chest pain, palpitations, lower extremity edema GU: negative for frequency, dysuria and hematuria.  No discharge Skin: No rash Heme: negative for easy bruising and gum/nose bleeding MS: negative for myalgias, arthralgias, back pain and muscle weakness Neurolo:negative for headaches, dizziness, vertigo, memory problems  Psych:  anxiety  Objective:  VITALS:  BP (!) 142/94   Pulse (!) 101   Temp 97.8 F (36.6 C) (Oral)   Resp 16   Ht 5\' 9"  (1.753 m)   Wt 200 lb (90.7 kg)   SpO2 95%   BMI 29.53 kg/m  PHYSICAL EXAM:  General: Alert, cooperative, no distress, appears stated age.  Head: Normocephalic, without obvious abnormality, atraumatic. Eyes: Conjunctivae clear, anicteric sclerae. Pupils are equal Nose: Nares normal. No drainage or sinus tenderness. Throat: Lips, mucosa, and tongue normal. No Thrush Neck: Supple, symmetrical, no adenopathy, thyroid: non tender no carotid bruit and no JVD. Back: No CVA tenderness. Lungs: Clear to auscultation bilaterally. No Wheezing or Rhonchi. No  rales. Extremities: Extremities normal, atraumatic, no cyanosis. No edema. No clubbing Skin: No rashes or lesions. Not Jaundiced Lymph: Cervical, supraclavicular normal. Neurologic:  Grossly non-focal Pertinent Labs  IMAGING RESULTS: Health maintenance Vaccination  Vaccine Date last given comment  Influenza    Hepatitis B    Hepatitis A    Prevnar-PCV-13    Pneumovac-PPSV-23    TdaP    HPV    Shingrix ( zoster vaccine)     ______________________  Labs Lab Result  Date comment  HIV VL 40 05/19/19   CD4 351 ( 25%) 05/19/19   Genotype See above Oct 2018   [id number]     HIV antibody     RPR 1:1 06/20/20   Quantiferon Gold NR 06/20/20   Hep C ab NR 12/31/18   Hepatitis B-ab,ag,c NR 12/31/18   Hepatitis A-IgM, IgG /T     Lipid     GC/CHL     PAP     HB,PLT,Cr, LFT       Preventive  Procedure Result  Date comment  colonoscopy     Mammogram     Dental exam June 2020    Opthal       Impression/Recommendation ? ?AIDS on Biktarvy.  100% adherent last CD4 is 364 and viral load is  30 from July 2021. We will do labs today  Treated syphilis- -secondary syphilis with skin rash and June 2020- also has abnormal LFT from 05/19/19- RPR 1:128 from 05/19/19-was started on Doxy by his PCP's office as he could not come for penicillin injection-received benzathine penicillin 2,400,000 units on 05/29/2019.  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    He did not want anal pap  HEPb sab present Says he got pneumococcal vaccine  Diabetes mellitus: On multiple medications as listed above last hemoglobin A1c is 10.3.  Followed by endocrinologist. HTN on amlodipine ?  Discussed with patient in great detail.  Follow up 6 months

## 2021-01-04 NOTE — Telephone Encounter (Signed)
-----   Message from Lynn Ito, MD sent at 01/04/2021  1:17 PM EST ----- Regarding: FW: Please let him the urine test was fine . Thx ----- Message ----- From: Leory Plowman, Lab In [place] Sent: 01/04/2021  12:07 PM EST To: Lynn Ito, MD

## 2021-01-05 LAB — T-HELPER CELLS CD4/CD8 %
% CD 4 Pos. Lymph.: 31.9 % (ref 30.8–58.5)
Absolute CD 4 Helper: 574 /uL (ref 359–1519)
Basophils Absolute: 0 10*3/uL (ref 0.0–0.2)
Basos: 1 %
CD3+CD4+ Cells/CD3+CD8+ Cells Bld: 0.74 — ABNORMAL LOW (ref 0.92–3.72)
CD3+CD8+ Cells # Bld: 776 /uL (ref 109–897)
CD3+CD8+ Cells NFr Bld: 43.1 % — ABNORMAL HIGH (ref 12.0–35.5)
EOS (ABSOLUTE): 0 10*3/uL (ref 0.0–0.4)
Eos: 1 %
Hematocrit: 45.6 % (ref 37.5–51.0)
Hemoglobin: 15.8 g/dL (ref 13.0–17.7)
Immature Grans (Abs): 0 10*3/uL (ref 0.0–0.1)
Immature Granulocytes: 0 %
Lymphocytes Absolute: 1.8 10*3/uL (ref 0.7–3.1)
Lymphs: 44 %
MCH: 30.6 pg (ref 26.6–33.0)
MCHC: 34.6 g/dL (ref 31.5–35.7)
MCV: 88 fL (ref 79–97)
Monocytes Absolute: 0.4 10*3/uL (ref 0.1–0.9)
Monocytes: 9 %
Neutrophils Absolute: 1.9 10*3/uL (ref 1.4–7.0)
Neutrophils: 45 %
Platelets: 338 10*3/uL (ref 150–450)
RBC: 5.16 x10E6/uL (ref 4.14–5.80)
RDW: 12.8 % (ref 11.6–15.4)
WBC: 4.1 10*3/uL (ref 3.4–10.8)

## 2021-01-05 LAB — RPR
RPR Ser Ql: REACTIVE — AB
RPR Titer: 1:1 {titer}

## 2021-01-05 LAB — HIV-1 RNA QUANT-NO REFLEX-BLD
HIV 1 RNA Quant: <20 copies/mL
LOG10 HIV-1 RNA: UNDETERMINED log10copy/mL

## 2021-01-08 LAB — T.PALLIDUM AB, TOTAL: T Pallidum Abs: REACTIVE — AB

## 2021-03-22 ENCOUNTER — Telehealth: Payer: Self-pay | Admitting: *Deleted

## 2021-03-22 NOTE — Telephone Encounter (Signed)
error 

## 2021-03-22 NOTE — Telephone Encounter (Addendum)
Received call from [facility]. They have been unable to reach the patient regarding his refill of biktarvy. Last refill was ordered 02/01/21 (arrived 02/08/21).  Patient needs to call [phone] to schedule delivery. RN reached out to patient, let him know pharmacy needed to speak with him, gave him the phone number. Merlyn Albert will call [facility] today.  Andree Coss, RN

## 2021-05-21 ENCOUNTER — Telehealth: Payer: Self-pay

## 2021-05-21 NOTE — Telephone Encounter (Signed)
Patient left vm in triage requesting routine follow up visit. Routing to [facility] for scheduling.  Valarie Cones

## 2021-05-23 NOTE — Telephone Encounter (Signed)
Left vm to call and schedule visit.

## 2021-10-09 ENCOUNTER — Telehealth: Payer: Self-pay

## 2021-10-09 NOTE — Telephone Encounter (Signed)
[facility] called about multiple requests that were sent requesting refills for patient's Biktarvy.   Sandie Ano, RN

## 2021-10-11 ENCOUNTER — Other Ambulatory Visit: Payer: Self-pay

## 2021-10-11 MED ORDER — BIKTARVY 50-200-25 MG PO TABS: 1.0000 | ORAL_TABLET | Freq: Every day | ORAL | 3 refills | Status: AC

## 2022-12-22 ENCOUNTER — Emergency Department (HOSPITAL_BASED_OUTPATIENT_CLINIC_OR_DEPARTMENT_OTHER)
Admission: EM | Admit: 2022-12-22 | Discharge: 2022-12-22 | Disposition: A | Discharge status: Home / Self Care | Payer: 59 | Attending: Emergency Medicine | Admitting: Emergency Medicine

## 2022-12-22 ENCOUNTER — Encounter (HOSPITAL_BASED_OUTPATIENT_CLINIC_OR_DEPARTMENT_OTHER): Payer: Self-pay | Admitting: Emergency Medicine

## 2022-12-22 ENCOUNTER — Other Ambulatory Visit: Payer: Self-pay

## 2022-12-22 ENCOUNTER — Emergency Department (HOSPITAL_BASED_OUTPATIENT_CLINIC_OR_DEPARTMENT_OTHER): Payer: 59

## 2022-12-22 DIAGNOSIS — Z79899 Other long term (current) drug therapy: Secondary | ICD-10-CM | POA: Insufficient documentation

## 2022-12-22 DIAGNOSIS — Z7951 Long term (current) use of inhaled steroids: Secondary | ICD-10-CM | POA: Diagnosis not present

## 2022-12-22 DIAGNOSIS — I10 Essential (primary) hypertension: Secondary | ICD-10-CM | POA: Insufficient documentation

## 2022-12-22 DIAGNOSIS — R739 Hyperglycemia, unspecified: Secondary | ICD-10-CM

## 2022-12-22 DIAGNOSIS — R42 Dizziness and giddiness: Secondary | ICD-10-CM | POA: Diagnosis present

## 2022-12-22 DIAGNOSIS — E1165 Type 2 diabetes mellitus with hyperglycemia: Secondary | ICD-10-CM | POA: Insufficient documentation

## 2022-12-22 DIAGNOSIS — J45909 Unspecified asthma, uncomplicated: Secondary | ICD-10-CM | POA: Insufficient documentation

## 2022-12-22 DIAGNOSIS — Z7984 Long term (current) use of oral hypoglycemic drugs: Secondary | ICD-10-CM | POA: Insufficient documentation

## 2022-12-22 LAB — I-STAT VENOUS BLOOD GAS, ED
Acid-base deficit: 4 mmol/L — ABNORMAL HIGH (ref 0.0–2.0)
Bicarbonate: 23 mmol/L (ref 20.0–28.0)
Calcium, Ion: 1.13 mmol/L — ABNORMAL LOW (ref 1.15–1.40)
HCT: 45 % (ref 39.0–52.0)
Hemoglobin: 15.3 g/dL (ref 13.0–17.0)
O2 Saturation: 71 %
Patient temperature: 98.2
Potassium: 6.2 mmol/L — ABNORMAL HIGH (ref 3.5–5.1)
Sodium: 134 mmol/L — ABNORMAL LOW (ref 135–145)
TCO2: 24 mmol/L (ref 22–32)
pCO2, Ven: 45.7 mmHg (ref 44–60)
pH, Ven: 7.309 (ref 7.25–7.43)
pO2, Ven: 40 mmHg (ref 32–45)

## 2022-12-22 LAB — URINALYSIS, ROUTINE W REFLEX MICROSCOPIC
Bilirubin Urine: NEGATIVE
Glucose, UA: >=500 mg/dL — AB
Ketones, ur: 80 mg/dL — AB
Leukocytes,Ua: NEGATIVE
Nitrite: NEGATIVE
Protein, ur: >=300 mg/dL — AB
Specific Gravity, Urine: >=1.03 (ref 1.005–1.030)
pH: 5.5 (ref 5.0–8.0)

## 2022-12-22 LAB — BASIC METABOLIC PANEL
Anion gap: 15 (ref 5–15)
BUN: 18 mg/dL (ref 6–20)
CO2: 23 mmol/L (ref 22–32)
Calcium: 9 mg/dL (ref 8.9–10.3)
Chloride: 93 mmol/L — ABNORMAL LOW (ref 98–111)
Creatinine, Ser: 0.97 mg/dL (ref 0.61–1.24)
GFR, Estimated: >60 mL/min (ref >60)
Glucose, Bld: 338 mg/dL — ABNORMAL HIGH (ref 70–99)
Potassium: 4.6 mmol/L (ref 3.5–5.1)
Sodium: 131 mmol/L — ABNORMAL LOW (ref 135–145)

## 2022-12-22 LAB — CBC WITH DIFFERENTIAL/PLATELET
Abs Immature Granulocytes: 0.01 10*3/uL (ref 0.00–0.07)
Basophils Absolute: 0 10*3/uL (ref 0.0–0.1)
Basophils Relative: 1 %
Eosinophils Absolute: 0 10*3/uL (ref 0.0–0.5)
Eosinophils Relative: 0 %
HCT: 46.4 % (ref 39.0–52.0)
Hemoglobin: 16.2 g/dL (ref 13.0–17.0)
Immature Granulocytes: 0 %
Lymphocytes Relative: 34 %
Lymphs Abs: 1.4 10*3/uL (ref 0.7–4.0)
MCH: 30.8 pg (ref 26.0–34.0)
MCHC: 34.9 g/dL (ref 30.0–36.0)
MCV: 88.2 fL (ref 80.0–100.0)
Monocytes Absolute: 0.3 10*3/uL (ref 0.1–1.0)
Monocytes Relative: 7 %
Neutro Abs: 2.5 10*3/uL (ref 1.7–7.7)
Neutrophils Relative %: 58 %
Platelets: 261 10*3/uL (ref 150–400)
RBC: 5.26 MIL/uL (ref 4.22–5.81)
RDW: 12.3 % (ref 11.5–15.5)
WBC: 4.3 10*3/uL (ref 4.0–10.5)
nRBC: 0 % (ref 0.0–0.2)

## 2022-12-22 LAB — URINALYSIS, MICROSCOPIC (REFLEX)

## 2022-12-22 LAB — CBG MONITORING, ED: Glucose-Capillary: 275 mg/dL — ABNORMAL HIGH (ref 70–99)

## 2022-12-22 MED ORDER — MECLIZINE HCL 25 MG PO TABS
25.0000 mg | ORAL_TABLET | Freq: Once | ORAL | Status: AC
  Administered 2022-12-22: 25 mg via ORAL
  Filled 2022-12-22: qty 1

## 2022-12-22 MED ORDER — SODIUM CHLORIDE 0.9 % IV BOLUS
1000.0000 mL | Freq: Once | INTRAVENOUS | Status: AC
  Administered 2022-12-22: 1000 mL via INTRAVENOUS

## 2022-12-22 MED ORDER — MECLIZINE HCL 25 MG PO TABS: 25.0000 mg | ORAL_TABLET | Freq: Three times a day (TID) | ORAL | 0 refills | Status: AC | PRN

## 2022-12-22 MED ORDER — ONDANSETRON HCL 4 MG/2ML IJ SOLN
4.0000 mg | Freq: Once | INTRAMUSCULAR | Status: AC
  Administered 2022-12-22: 4 mg via INTRAVENOUS
  Filled 2022-12-22: qty 2

## 2022-12-22 NOTE — ED Provider Notes (Signed)
Physical Exam  BP (!) 131/91   Pulse 88   Temp 98.2 F (36.8 C) (Oral)   Resp 17   Ht 5\' 9"  (1.753 m)   Wt 86.2 kg   SpO2 98%   BMI 28.06 kg/m   Physical Exam Vitals and nursing note reviewed.  Constitutional:      General: He is not in acute distress.    Appearance: Normal appearance. He is normal weight. He is not ill-appearing.  HENT:     Head: Normocephalic and atraumatic.  Pulmonary:     Effort: Pulmonary effort is normal. No respiratory distress.  Abdominal:     General: Abdomen is flat.  Musculoskeletal:        General: Normal range of motion.     Cervical back: Neck supple.  Skin:    General: Skin is warm and dry.  Neurological:     Mental Status: He is alert and oriented to person, place, and time.  Psychiatric:        Mood and Affect: Mood normal.        Behavior: Behavior normal.     Procedures  Procedures  ED Course / MDM   Clinical Course as of 12/22/22 2054  2055 Dec 22, 2022  1845 Dizziness and fatigue starting today, out of trulicity for past 2 weeks. Recently tested positive for influenza B.  [AS]    Clinical Course User Index [AS] Patti Shorb, Dec 24, 2022, PA-C   Medical Decision Making Amount and/or Complexity of Data Reviewed Labs: ordered. Radiology: ordered.  Risk Prescription drug management.  This patient presents to the ED for concern of dizziness and fatigue, this involves an extensive number of treatment options, and is a complaint that carries with it a high risk of complications and morbidity.  The differential diagnosis includes intracranial abnormalities, BPPV, upper respiratory infection  I reviewed and interpreted labs which include: BMP, CBC, urinalysis, VBG.  Glucose initially 338 and urine shows glucose urea and ketonuria.  Anion gap is normal.  VBG shows no acidosis.  VBG does show a potassium of 6.2, however this was after a normal BMP with a potassium of 4.6.  Likely hemolyzed  I ordered imaging studies including chest  x-ray I independently visualized and interpreted imaging which showed normal I agree with the radiologist interpretation  Assumed care at shift change from [facility], PA-C.  Please see her note for full HPI.  Afebrile, slightly hypertensive but otherwise hemodynamically stable.  36 year old male presenting to the ED for evaluation of fatigue and dizziness.  He was found to have hyperglycemia of 338.  Has not been able to take his Trulicity for 2 weeks due to the medication being on backorder.  His primary care provider did not start him on a different medication, instead doubled his dose of metformin.  This may be attributing to his nausea and vomiting in the ED.  He was not found to be in acidosis in the ED.  He is encouraged to follow-up with his primary care provider for further hyperglycemia management.  He was requesting a prescription for meclizine.  His dizziness improved with meclizine.  It is thought that BPPV is a cause of his symptoms.  His hyperglycemia may be contributing as well.  He was given strict return precautions.  Stable at discharge.  At this time there does not appear to be any evidence of an acute emergency medical condition and the patient appears stable for discharge with appropriate outpatient follow up. Diagnosis was discussed with  patient who verbalizes understanding of care plan and is agreeable to discharge. I have discussed return precautions with patient who verbalizes understanding. Patient encouraged to follow-up with their PCP within 1 week. All questions answered.  Patient's case discussed with Dr. Langston Masker who agrees with plan to discharge with follow-up.   Note: Portions of this report may have been transcribed using voice recognition software. Every effort was made to ensure accuracy; however, inadvertent computerized transcription errors may still be present.    Leroy Alvarado 12/22/22 2131    Wyvonnia Dusky, MD 12/22/22 [id number]

## 2022-12-22 NOTE — ED Notes (Signed)
D/c paperwork reviewed with pt, including prescriptions.  No questions or concerns voiced at time of d/c. . Pt verbalized understanding, Ambulatory with family to ED exit, NAD.   

## 2022-12-22 NOTE — ED Notes (Signed)
Pt with one emesis occurrence immediately following meclizine administration. EDP Meghan made aware.

## 2022-12-22 NOTE — ED Provider Notes (Signed)
[facility] EMERGENCY DEPARTMENT Provider Note   CSN: [id number] Arrival date & time: 12/22/22  1617     History  Chief Complaint  Patient presents with   Dizziness    Leroy Alvarado is a 37 y.o. male with history significant for asthma, hypertension, HIV (compliant with medications), and diabetes presents to the ED complaining of fatigue and dizziness that began today.  He also has associated nausea and vomiting.  He was diagnosed with influenza B a few days prior and has been taking Tamiflu.  He states he woke up dizzy this morning and the "room is spinning".  He does still have a cough, but reports it is improving.  Denies lightheadedness, syncope, diarrhea, abdominal pain, headache.        Home Medications Prior to Admission medications   Medication Sig Start Date End Date Taking? Authorizing Provider  acetaminophen (TYLENOL) 325 MG tablet Take 650 mg by mouth every 6 (six) hours as needed.    [provider]  albuterol (PROVENTIL HFA;VENTOLIN HFA) 108 (90 BASE) MCG/ACT inhaler Inhale 1 puff into the lungs every 6 (six) hours as needed for shortness of breath.     [provider]  albuterol (PROVENTIL) (2.5 MG/3ML) 0.083% nebulizer solution Take 3 mLs (2.5 mg total) by nebulization every 4 (four) hours as needed for wheezing or shortness of breath. 09/19/15   Erin Fulling, MD  amLODipine (NORVASC) 10 MG tablet Take 1 tablet by mouth daily. 05/06/19   [provider]  bictegravir-emtricitabine-tenofovir AF (BIKTARVY) 50-200-25 MG TABS tablet Take 1 tablet by mouth daily. 10/11/21   Lynn Ito, MD  metFORMIN (GLUCOPHAGE) 500 MG tablet Take by mouth 2 (two) times daily with a meal.    [provider]  Multiple Vitamin (MULTIVITAMIN) capsule Take 1 capsule by mouth daily.    [provider]      Allergies    Mushroom extract complex and Tamiflu [oseltamivir]    Review of Systems   Review of Systems   Constitutional:  Negative for chills and fever.  Eyes:  Negative for visual disturbance.  Respiratory:  Positive for cough.   Gastrointestinal:  Positive for nausea and vomiting. Negative for abdominal pain and diarrhea.  Neurological:  Positive for dizziness. Negative for syncope, weakness, light-headedness and headaches.    Physical Exam Updated Vital Signs BP (!) 139/92   Pulse 84   Temp 98.1 F (36.7 C) (Oral)   Resp 19   Ht 5\' 9"  (1.753 m)   Wt 86.2 kg   SpO2 98%   BMI 28.06 kg/m  Physical Exam Vitals and nursing note reviewed.  Constitutional:      General: He is not in acute distress.    Appearance: He is not ill-appearing or diaphoretic.  HENT:     Nose: No congestion or rhinorrhea.     Mouth/Throat:     Mouth: Mucous membranes are moist.     Pharynx: Oropharynx is clear.  Cardiovascular:     Rate and Rhythm: Normal rate and regular rhythm.     Pulses: Normal pulses.     Heart sounds: Normal heart sounds.  Pulmonary:     Effort: Pulmonary effort is normal. No tachypnea, accessory muscle usage or respiratory distress.     Breath sounds: Normal breath sounds and air entry. No wheezing, rhonchi or rales.  Abdominal:     General: Abdomen is flat. Bowel sounds are normal. There is no distension.     Palpations: Abdomen is soft.  Tenderness: There is no abdominal tenderness.  Skin:    General: Skin is warm and dry.     Capillary Refill: Capillary refill takes less than 2 seconds.  Neurological:     Mental Status: He is alert. Mental status is at baseline.  Psychiatric:        Mood and Affect: Mood normal.        Behavior: Behavior normal.     ED Results / Procedures / Treatments   Labs (all labs ordered are listed, but only abnormal results are displayed) Labs Reviewed  BASIC METABOLIC PANEL - Abnormal; Notable for the following components:      Result Value   Sodium 131 (*)    Chloride 93 (*)    Glucose, Bld 338 (*)    All other components within  normal limits  URINALYSIS, ROUTINE W REFLEX MICROSCOPIC - Abnormal; Notable for the following components:   Glucose, UA >=500 (*)    Hgb urine dipstick TRACE (*)    Ketones, ur 80 (*)    Protein, ur >=300 (*)    All other components within normal limits  URINALYSIS, MICROSCOPIC (REFLEX) - Abnormal; Notable for the following components:   Bacteria, UA FEW (*)    All other components within normal limits  CBC WITH DIFFERENTIAL/PLATELET  I-STAT VENOUS BLOOD GAS, ED    EKG EKG Interpretation  Date/Time:  Sunday December 22 2022 17:19:31 EST Ventricular Rate:  91 PR Interval:  134 QRS Duration: 84 QT Interval:  367 QTC Calculation: 452 R Axis:   56 Text Interpretation: Sinus rhythm Right atrial enlargement Confirmed by Octaviano Glow [phone]) on 12/22/2022 5:43:43 PM  Radiology DG Chest 2 View  Result Date: 12/22/2022 CLINICAL DATA:  Fatigue and dizziness.  Flu diagnosis. EXAM: CHEST - 2 VIEW COMPARISON:  Radiographs 05/02/2017 and 12/20/2015. FINDINGS: The heart size and mediastinal contours are stable. The lungs are clear. There is no pleural effusion or pneumothorax. No acute osseous findings are identified. Telemetry leads overlie the chest. IMPRESSION: No active cardiopulmonary process. Electronically Signed   By: Richardean Sale M.D.   On: 12/22/2022 17:40    Procedures Procedures    Medications Ordered in ED Medications  meclizine (ANTIVERT) tablet 25 mg (25 mg Oral Given 12/22/22 1739)  sodium chloride 0.9 % bolus 1,000 mL (1,000 mLs Intravenous New Bag/Given 12/22/22 1741)  ondansetron (ZOFRAN) injection 4 mg (4 mg Intravenous Given 12/22/22 1744)    ED Course/ Medical Decision Making/ A&P Clinical Course as of 12/22/22 1851  Sun Dec 22, 2022  1845 Dizziness and fatigue starting today, out of trulicity for past 2 weeks. Recently tested positive for influenza B.  [AS]    Clinical Course User Index [AS] Schutt, Grafton Folk, PA-C                           Medical Decision  Making Amount and/or Complexity of Data Reviewed Labs: ordered. Radiology: ordered.   This patient presents to the ED with chief complaint(s) of dizziness, fatigue, nausea and vomiting with pertinent past medical history of HIV, hypertension, asthma, diabetes.  The complaint involves an extensive differential diagnosis and also carries with it a high risk of complications and morbidity.    The differential diagnosis includes pneumonia secondary to influenza, hypo- or hyperglycemia, uncontrolled hypertension, BPPV, dehydration   The initial plan is to obtain baseline labs, ECG, and chest x-ray  Additional history obtained: Additional history obtained from  none Records reviewed  internal medicine visit from 12/18/22 and associated labs  Initial Assessment:   On exam, patient appears comfortable in bed and is not in acute distress.  Heart rate is normal with a regular rhythm.  He is hypertensive, does have a history of uncontrolled hypertension.  Lungs are clear to auscultation bilaterally.  Abdomen is soft and nontender to palpation, and nondistended.  Skin is warm and dry with good turgor.  Independent ECG/labs interpretation:  The following labs were independently interpreted:  Urinalysis significant for glucosuria, ketonuria, and proteinuria; patient appears to be somewhat dehydrated. CBC reveals no evidence of leukocytosis, leukopenia or anemia. Metabolic panel significant for hyperglycemia with a glucose of 338, there is hyponatremia and hypochloremia as well.  Potassium is within normal limits.  Kidney function is within normal limits.  Anion gap is normal.  Independent visualization and interpretation of imaging: I independently visualized the following imaging with scope of interpretation limited to determining acute life threatening conditions related to emergency care: Chest x-ray, which revealed no evidence of pneumonia, pneumothorax, or pleural effusions.  Heart is of normal size.   I agree with radiologist interpretation.  Treatment and Reassessment: Will treat patient with fluids and Zofran.  Patient was also given meclizine, was informed by RN that he was not able to keep it down.  Upon reassessment of patient, he received approximately half of his fluid bolus and reports feeling somewhat better.  He is still having dizziness, however he experiences this when he turns his head side-to-side or up and down.  Does not seem consistent with BPPV.  He does not have any nystagmus.  His blood pressure has improved while in ED.  Patient has been out of his Trulicity due to it being out of stock and was recently placed back on 1000 mg of metformin twice a day.  Will reassess patient's blood sugar following fluid bolus.  If patient symptoms improved and he is able to tolerate a PO challenge after Zofran, plan to discharge patient home.  Disposition:   6:51 PM Care transferred to [facility] and Dr. Renaye Rakers at the end of my shift as the patient will require reassessment once labs/imaging have resulted. Patient presentation, ED course, and plan of care discussed with review of all pertinent labs and imaging. Please see his/her note for further details regarding further ED course and disposition. Plan at time of handoff is reassess symptoms and blood sugar following fluids.  Patient may require additional fluid bolus.  Plan to is discharge patient with close PCP follow-up as I believe this is related to poorly controlled diabetes and hyperglycemia. This may be altered or completely changed at the discretion of the oncoming team pending results of further workup.            Final Clinical Impression(s) / ED Diagnoses Final diagnoses:  Hyperglycemia    Rx / DC Orders ED Discharge Orders     None         Lenard Simmer, [place] 12/22/22 1851    Terald Sleeper, MD 12/22/22 [phone]

## 2022-12-22 NOTE — Discharge Instructions (Signed)
Thank you for allowing me to be part of your care today.  You were evaluated for dizziness.  You were found to have high blood sugar, which may be contributing to your symptoms.  I recommend following up with your primary care provider as soon as possible.  Be sure to stay well-hydrated and closely monitor your blood sugar.    Return to the ER if you develop any worsening of your symptoms or have any new concerns.

## 2022-12-22 NOTE — ED Triage Notes (Signed)
Pt arrives pov, slow gait, endorses fatigue and "dizziness"  with emesis today. Denies HA. Recent flu dx, VAN neg, - BEFAST. Reports taking tamiflu
